# Patient Record
Sex: Female | Born: 1940
Health system: Southern US, Community
[De-identification: ages and names within clinical notes are randomized; demographics above are authoritative.]

## PROBLEM LIST (undated history)

## (undated) DIAGNOSIS — E119 Type 2 diabetes mellitus without complications: Secondary | ICD-10-CM

## (undated) DIAGNOSIS — I10 Essential (primary) hypertension: Secondary | ICD-10-CM

## (undated) DIAGNOSIS — G35 Multiple sclerosis: Secondary | ICD-10-CM

## (undated) HISTORY — PX: BLADDER SURGERY: SHX569

## (undated) HISTORY — PX: ABDOMINAL HYSTERECTOMY: SHX81

---

## 1998-08-23 ENCOUNTER — Ambulatory Visit (HOSPITAL_COMMUNITY): Admission: RE | Admit: 1998-08-23 | Discharge: 1998-08-23 | Payer: Self-pay | Admitting: Pulmonary Disease

## 1999-06-14 ENCOUNTER — Encounter: Payer: Self-pay | Admitting: Obstetrics and Gynecology

## 1999-06-15 ENCOUNTER — Ambulatory Visit (HOSPITAL_COMMUNITY): Admission: RE | Admit: 1999-06-15 | Discharge: 1999-06-15 | Payer: Self-pay | Admitting: Obstetrics and Gynecology

## 2000-04-04 ENCOUNTER — Ambulatory Visit (HOSPITAL_COMMUNITY): Admission: RE | Admit: 2000-04-04 | Discharge: 2000-04-04 | Payer: Self-pay | Admitting: Pulmonary Disease

## 2000-04-04 ENCOUNTER — Encounter: Payer: Self-pay | Admitting: Pulmonary Disease

## 2000-06-17 ENCOUNTER — Ambulatory Visit (HOSPITAL_COMMUNITY): Admission: RE | Admit: 2000-06-17 | Discharge: 2000-06-17 | Payer: Self-pay | Admitting: Obstetrics and Gynecology

## 2000-06-17 ENCOUNTER — Encounter: Payer: Self-pay | Admitting: Obstetrics and Gynecology

## 2000-11-26 ENCOUNTER — Other Ambulatory Visit: Admission: RE | Admit: 2000-11-26 | Discharge: 2000-11-26 | Payer: Self-pay | Admitting: Obstetrics and Gynecology

## 2001-07-09 ENCOUNTER — Ambulatory Visit (HOSPITAL_COMMUNITY): Admission: RE | Admit: 2001-07-09 | Discharge: 2001-07-09 | Payer: Self-pay | Admitting: Obstetrics and Gynecology

## 2001-07-09 ENCOUNTER — Encounter: Payer: Self-pay | Admitting: Obstetrics and Gynecology

## 2001-11-10 ENCOUNTER — Ambulatory Visit (HOSPITAL_COMMUNITY): Admission: RE | Admit: 2001-11-10 | Discharge: 2001-11-10 | Payer: Self-pay | Admitting: Neurology

## 2001-11-10 ENCOUNTER — Encounter: Payer: Self-pay | Admitting: Neurology

## 2002-02-05 ENCOUNTER — Other Ambulatory Visit: Admission: RE | Admit: 2002-02-05 | Discharge: 2002-02-05 | Payer: Self-pay | Admitting: Obstetrics and Gynecology

## 2002-07-14 ENCOUNTER — Encounter: Payer: Self-pay | Admitting: Obstetrics and Gynecology

## 2002-07-14 ENCOUNTER — Ambulatory Visit (HOSPITAL_COMMUNITY): Admission: RE | Admit: 2002-07-14 | Discharge: 2002-07-14 | Payer: Self-pay | Admitting: Obstetrics and Gynecology

## 2002-12-13 ENCOUNTER — Ambulatory Visit (HOSPITAL_COMMUNITY): Admission: RE | Admit: 2002-12-13 | Discharge: 2002-12-13 | Payer: Self-pay | Admitting: Pulmonary Disease

## 2002-12-13 ENCOUNTER — Encounter: Payer: Self-pay | Admitting: Pulmonary Disease

## 2003-08-03 ENCOUNTER — Ambulatory Visit (HOSPITAL_COMMUNITY): Admission: RE | Admit: 2003-08-03 | Discharge: 2003-08-03 | Payer: Self-pay | Admitting: Obstetrics and Gynecology

## 2004-08-03 ENCOUNTER — Ambulatory Visit (HOSPITAL_COMMUNITY): Admission: RE | Admit: 2004-08-03 | Discharge: 2004-08-03 | Payer: Self-pay | Admitting: Obstetrics and Gynecology

## 2004-12-31 ENCOUNTER — Observation Stay (HOSPITAL_COMMUNITY): Admission: RE | Admit: 2004-12-31 | Discharge: 2005-01-01 | Payer: Self-pay | Admitting: Obstetrics and Gynecology

## 2005-01-01 ENCOUNTER — Encounter (INDEPENDENT_AMBULATORY_CARE_PROVIDER_SITE_OTHER): Payer: Self-pay | Admitting: *Deleted

## 2005-08-05 ENCOUNTER — Ambulatory Visit (HOSPITAL_COMMUNITY): Admission: RE | Admit: 2005-08-05 | Discharge: 2005-08-05 | Payer: Self-pay | Admitting: Obstetrics and Gynecology

## 2006-08-07 ENCOUNTER — Ambulatory Visit (HOSPITAL_COMMUNITY): Admission: RE | Admit: 2006-08-07 | Discharge: 2006-08-07 | Payer: Self-pay | Admitting: Obstetrics and Gynecology

## 2006-10-08 ENCOUNTER — Encounter (INDEPENDENT_AMBULATORY_CARE_PROVIDER_SITE_OTHER): Payer: Self-pay | Admitting: Specialist

## 2006-10-08 ENCOUNTER — Ambulatory Visit (HOSPITAL_COMMUNITY): Admission: RE | Admit: 2006-10-08 | Discharge: 2006-10-08 | Payer: Self-pay | Admitting: Gastroenterology

## 2007-08-10 ENCOUNTER — Ambulatory Visit (HOSPITAL_COMMUNITY): Admission: RE | Admit: 2007-08-10 | Discharge: 2007-08-10 | Payer: Self-pay | Admitting: Obstetrics and Gynecology

## 2008-08-10 ENCOUNTER — Ambulatory Visit (HOSPITAL_COMMUNITY): Admission: RE | Admit: 2008-08-10 | Discharge: 2008-08-10 | Payer: Self-pay | Admitting: Obstetrics and Gynecology

## 2010-07-04 ENCOUNTER — Other Ambulatory Visit (HOSPITAL_COMMUNITY): Payer: Self-pay | Admitting: Obstetrics and Gynecology

## 2010-07-04 DIAGNOSIS — Z1231 Encounter for screening mammogram for malignant neoplasm of breast: Secondary | ICD-10-CM

## 2010-07-10 ENCOUNTER — Ambulatory Visit (HOSPITAL_COMMUNITY): Payer: Medicare Other

## 2010-07-18 ENCOUNTER — Ambulatory Visit (HOSPITAL_COMMUNITY)
Admission: RE | Admit: 2010-07-18 | Discharge: 2010-07-18 | Disposition: A | Discharge status: Home / Self Care | Payer: Medicare Other | Source: Ambulatory Visit | Attending: Obstetrics and Gynecology | Admitting: Obstetrics and Gynecology

## 2010-07-18 DIAGNOSIS — Z1231 Encounter for screening mammogram for malignant neoplasm of breast: Secondary | ICD-10-CM

## 2010-10-09 NOTE — Op Note (Signed)
NAME:  Morgan [name], Morgan [name]                 ACCOUNT NO.:  [id number]   MEDICAL RECORD NO.:  [id number]          PATIENT TYPE:  AMB   LOCATION:  ENDO                         FACILITY:  [facility]   PHYSICIAN:  Anselmo Rod, M.D.  DATE OF BIRTH:  April 17, 1941   DATE OF PROCEDURE:  10/08/2006  DATE OF DISCHARGE:                               OPERATIVE REPORT   PROCEDURE PERFORMED:  Colonoscopy with snare polypectomy x3 and cold  biopsies x3.   ENDOSCOPIST:  Anselmo Rod, M.D.   INSTRUMENT USED:  Pentax video colonoscope.   INDICATIONS FOR PROCEDURE:  70 year old African American female  undergoing screening colonoscopy to rule out colonic polyps, masses,  etc.   PREPROCEDURE PREPARATION:  Informed consent was procured from the  patient.  The patient fasted for 8 hours prior to the procedure after  being prepped with a bottle of magnesium citrate and a gallon of  NuLytely the night prior to the procedure.  Risks and benefits of the  procedure including 10% miss rate of cancer and polyp were discussed  with the patient as well.   PREPROCEDURE PHYSICAL:  The patient had stable vital signs.  Neck  supple.  Chest clear to auscultation.  S1, S2 regular.  Abdomen soft  with normal bowel sounds.   DESCRIPTION OF PROCEDURE:  The patient was placed in the left lateral  decubitus position and sedated with 100 mcg of Fentanyl and 10 mg of  Versed given intravenously in slow incremental doses. Once the patient  was adequately sedated and maintained on low-flow oxygen and continuous  cardiac monitoring, the Pentax video colonoscope was advanced from the  rectum to the cecum.  Three small sessile polyps were removed from the  cecum and proximal right colon (one from the cecum and two from the  proximal right colon, respectively).Three small erosions were biopsied  from the rectosigmoid colon from 20-30 cm.  No other abnormalities were  noted.  No diverticula were present.  The terminal ileum was not  visualized.  The patient has small internal hemorrhoids seen on  retroflexion.  The patient had a very tortuous colon.  The patient's  position was changed from the left lateral to the supine and the right  lateral position with gentle application of abdominal pressure reached  the cecum.   IMPRESSION:  1. Three polyps removed from the right side, one from the cecum and      two from the proximal right colon (hot snare x3).  2. Three small erosions biopsied from 20-30 cm.  3. Small internal hemorrhoids seen on retroflexion.  4. No evidence of diverticulosis.   RECOMMENDATIONS:  1. Await pathology results.  2. Avoid all nonsteroidals including aspirin for the next 2 weeks.  3. Repeat colonoscopy depending on pathology results.  4. Outpatient follow-up as need arises in the future.      Anselmo Rod, M.D.  Electronically Signed     JNM/MEDQ  D:  10/08/2006  T:  10/08/2006  Job:  [id number]   cc:   Mina Marble, M.D.  Janine Limbo, M.D.

## 2010-10-12 NOTE — Op Note (Signed)
Morgan Morgan [name], Morgan [name]                 ACCOUNT NO.:  [id number]   MEDICAL RECORD NO.:  [id number]          PATIENT TYPE:  OBV   LOCATION:  9316                          [facility]  [facility]   PHYSICIAN:  Osborn Coho, M.D.   DATE OF BIRTH:  1940/07/19   DATE OF PROCEDURE:  12/31/2004  DATE OF DISCHARGE:  01/01/2005                                 OPERATIVE REPORT   PREOPERATIVE DIAGNOSES:  1.  Symptomatic cystocele, rectocele, enterocele.  2.  Urinary incontinence.   POSTOPERATIVE DIAGNOSES:  1.  Symptomatic cystocele, rectocele, enterocele.  2.  Urinary incontinence.  3.  Bladder masses.   OPERATION/PROCEDURE:  1.  Anterior and posterior repair.  2.  Enterocele repair.  3.  Transvaginal taping.  4.  Cystoscopy.   SURGEON:  Osborn Coho, M.D.   ASSISTANT:  Marquis Lunch. Lowell Guitar, P.A.-C.   ANESTHESIA:  General.   SPECIMEN TO CYTOLOGY:  Urine for cytology.   FLUIDS:  1500 mL.   URINARY OUTPUT:  500 mL.   ESTIMATED BLOOD LOSS:  300 mL.   COMPLICATIONS:  None.   FINDINGS:  In total approximately 1-2 cm what appeared to be bladder masses.   DESCRIPTION OF PROCEDURE:  The patient was taken to the operating room after  the risks, benefits and alternatives were discussed with the patient.  The  patient verbalized understanding and consent signed and witnessed.  The  patient was placed under general anesthesia and prepped and draped in the  normal sterile fashion in the dorsal lithotomy position.  A weighted  speculum was placed in the vagina and the anterior vaginal wall injected  with loop Pitressin.  The vaginal wall mucosa was excised and the underlying  cystocele dissected away from the vaginal mucosa.  Cystocele was repaired  using a pursestring stitch of 2-0 chromic.  Kelly plications stitches were  then performed using 2-0 Vicryl along the length of the cystocele.  The  vaginal mucosa was then reapproximated using interrupted 2-0 Vicryl.  Attention was then turned to the  posterior vaginal wall which was injected  with dilute Pitressin.  The posterior vaginal wall was incised in the  midline and the rectocele dissected away from the vaginal wall mucosa.  Enterocele was noted and enterocele sac was repaired using a pursestring  stitch of 2-0 chromic.  The rectocele was repaired using plication stitches  of 2-0 Vicryl.  The vaginal mucosa was reapproximated using 2-0 Vicryl  interrupteds.   Attention was then turned to the anterior vaginal wall underlying the mid  urethra and was injected with dilute Pitressin.  The mucosa was incised and  the underlying tissue and urethra were dissected away making two pockets  bilaterally toward the symphysis pubis.   Attention was then turned to the mons pubis and at the level of the  symphysis pubis, two fingerbreadths from the midline bilaterally, a 5 mm  incision was made.  The abdominal guide was passed from the mons pubis  incisions through the pockets created at the level of the mid urethra along  the anterior vaginal wall.  The TVT was then  attached and elevated up  through the incisions after cystoscopy was performed and no entry of the  abdominal guides were noted into the bladder.  Prior to passing the  abdominal guides, the bladder was emptied completely and an 18 gauge  catheter with ureteral catheter guide was placed to divert the urethra as  the abdominal guides were passed bilaterally.  After the tape was elevated  through the bilateral mons pubis incisions, cystoscopy was performed again  and no entry into the bladder was noted.  There were two approximately 0.5  cm areas of bladder masses that were noted and adjacent to one another.  Urine cytology was sent.  The tape was flat beneath the urethra leaving  approximately 0.5 cm between the tape and the mid urethra.  The sheaths were  removed and the tape excised flush with the incisions at the mons pubis.  The vaginal mucosa was repaired using interrupteds  of 2-0 Vicryl and the  mons pubis incisions were repaired with 4-0 Monocryl using a subcuticular  stitch.  An 18-French catheter was used throughout this portion of the  procedure and throughout the urethra and catheter were prepped with  Betadine.  The vagina was packed with estrace soaked, two-inch plain  packing.  Sponge, lap and needle counts were correct.  The patient tolerated  the procedure well and was returned to the recovery room in good condition.      Osborn Coho, M.D.  Electronically Signed     AR/MEDQ  D:  01/18/2005  T:  01/18/2005  Job:  [id number]

## 2010-10-12 NOTE — H&P (Signed)
NAMEJUANNA, PUDLO                 ACCOUNT NO.:  [id number]   MEDICAL RECORD NO.:  [id number]          PATIENT TYPE:  AMB   LOCATION:  SDC                           [facility]ACILITY:  [facility]   PHYSICIAN:  Osborn Coho, M.D.   DATE OF BIRTH:  1940-06-11   DATE OF ADMISSION:  DATE OF DISCHARGE:                                HISTORY & PHYSICAL   CHIEF COMPLAINT:  Urge and stress incontinence and prolapsed bladder.   HISTORY:  Morgan [name] is a 70 year old gravida 2 para 2 status post a  hysterectomy about 25 years ago with complaints of mixed urinary  incontinence and prolapsed bladder. She has tried Scientist, clinical (histocompatibility and immunogenetics) but self  discontinued the Detrol secondary to fear of taking medicines and causing  problems with her eyes.   PAST OBSTETRICAL HISTORY:  Spontaneous vaginal deliveries full-term x2.   PAST GYNECOLOGICAL HISTORY:  Status post hysterectomy. Denies a history of  gonorrhea, chlamydia. Denies a history of abnormal Pap smears. Last  mammogram in March 2006 was negative.   PAST MEDICAL HISTORY:  Multiple sclerosis.   PAST SURGICAL HISTORY:  Abdominal hysterectomy.   MEDICINES:  Altace, Ultram, Flexeril, and antihypertensive medications.   SOCIAL HISTORY:  Denies cigarette use, alcohol use, or illicit drug use.   ALLERGIES:  No known drug allergies.   FAMILY HISTORY:  Stomach cancer in mother, lung cancer in dad, and prostate  cancer.   REVIEW OF SYSTEMS:  Denies chest pain, shortness of breath with activity.  Denies any GI problems or GU problems except as noted above. Denies fevers,  chills, nausea, vomiting, diarrhea.   PHYSICAL EXAMINATION:  VITAL SIGNS:  Blood pressure 124/90.  HEENT:  Within normal limits. Thyroid not enlarged.  HEART:  Rate and rhythm are regular.  CHEST:  Clear to auscultation bilaterally.  BREAST:  Exam in December 2005 is without masses, discharge, skin change, or  nipple retraction.  BACK:  No CVA tenderness.  ABDOMEN:  Soft and nontender.  EXTREMITIES:   Within normal limits.  PELVIC:  Normal external genitalia, atrophic vagina. Large cystocele.  TESTING:  Cystometrics:  Q-tip of 70-75 degrees, bladder capacity 150 mL.   ASSESSMENT AND PLAN:  Morgan [name] is a 70 year old gravida 2 para 2 with a  history of mixed urinary incontinence, hypermobile urethra and symptomatic  cystocele. The patient has tried conservative management and now would like  surgical management with repair of the cystocele, possibly repair of a  rectocele, and TVT procedure. The risks, benefits, and alternatives have  been reviewed with the patient including but not limited to bleeding,  infection, injury, urinary retention, and erosion of mesh. The patient  verbalized understanding and questions answered. Preoperative labs to be  done prior to procedure and consents to be signed and witnessed prior to  procedure as well.      Osborn Coho, M.D.  Electronically Signed     AR/MEDQ  D:  12/31/2004  T:  12/31/2004  Job:  [id number]

## 2010-11-01 ENCOUNTER — Inpatient Hospital Stay (INDEPENDENT_AMBULATORY_CARE_PROVIDER_SITE_OTHER)
Admission: RE | Admit: 2010-11-01 | Discharge: 2010-11-01 | Disposition: A | Discharge status: Home / Self Care | Payer: Medicare Other | Source: Ambulatory Visit | Attending: Family Medicine | Admitting: Family Medicine

## 2010-11-01 DIAGNOSIS — T783XXA Angioneurotic edema, initial encounter: Secondary | ICD-10-CM

## 2011-04-11 ENCOUNTER — Other Ambulatory Visit: Payer: Self-pay | Admitting: Neurology

## 2011-04-11 DIAGNOSIS — W19XXXA Unspecified fall, initial encounter: Secondary | ICD-10-CM

## 2011-04-11 DIAGNOSIS — G35 Multiple sclerosis: Secondary | ICD-10-CM

## 2011-04-11 DIAGNOSIS — S0990XA Unspecified injury of head, initial encounter: Secondary | ICD-10-CM

## 2011-04-11 DIAGNOSIS — E119 Type 2 diabetes mellitus without complications: Secondary | ICD-10-CM

## 2011-04-11 DIAGNOSIS — H409 Unspecified glaucoma: Secondary | ICD-10-CM

## 2011-04-16 ENCOUNTER — Ambulatory Visit
Admission: RE | Admit: 2011-04-16 | Discharge: 2011-04-16 | Disposition: A | Discharge status: Home / Self Care | Payer: Medicare Other | Source: Ambulatory Visit | Attending: Neurology | Admitting: Neurology

## 2011-04-16 DIAGNOSIS — G35 Multiple sclerosis: Secondary | ICD-10-CM

## 2011-04-16 DIAGNOSIS — E119 Type 2 diabetes mellitus without complications: Secondary | ICD-10-CM

## 2011-04-16 DIAGNOSIS — W19XXXA Unspecified fall, initial encounter: Secondary | ICD-10-CM

## 2011-04-16 DIAGNOSIS — H409 Unspecified glaucoma: Secondary | ICD-10-CM

## 2011-04-16 DIAGNOSIS — S0990XA Unspecified injury of head, initial encounter: Secondary | ICD-10-CM

## 2012-08-21 ENCOUNTER — Emergency Department (INDEPENDENT_AMBULATORY_CARE_PROVIDER_SITE_OTHER): Payer: Medicare Other

## 2012-08-21 ENCOUNTER — Emergency Department (INDEPENDENT_AMBULATORY_CARE_PROVIDER_SITE_OTHER)
Admission: EM | Admit: 2012-08-21 | Discharge: 2012-08-21 | Disposition: A | Discharge status: Home / Self Care | Payer: Medicare Other | Source: Home / Self Care | Attending: Family Medicine | Admitting: Family Medicine

## 2012-08-21 ENCOUNTER — Encounter (HOSPITAL_COMMUNITY): Payer: Self-pay | Admitting: Emergency Medicine

## 2012-08-21 DIAGNOSIS — S63599A Other specified sprain of unspecified wrist, initial encounter: Secondary | ICD-10-CM

## 2012-08-21 DIAGNOSIS — S66819A Strain of other specified muscles, fascia and tendons at wrist and hand level, unspecified hand, initial encounter: Secondary | ICD-10-CM

## 2012-08-21 HISTORY — DX: Essential (primary) hypertension: I10

## 2012-08-21 HISTORY — DX: Multiple sclerosis: G35

## 2012-08-21 HISTORY — DX: Type 2 diabetes mellitus without complications: E11.9

## 2012-08-21 NOTE — ED Notes (Signed)
Pt states that she was in exercise class today and bent over to get ball and fell over falling on left arm injuring wrist. Some mild swelling present.   Ice was applied to wrist which helped with pain but pain has gradually came back and getting worse.   Incident happened today between 10/ 11 a.m this morning.

## 2012-08-21 NOTE — ED Provider Notes (Signed)
History     CSN: [id number]  Arrival date & time 08/21/12  1845   None     Chief Complaint  Patient presents with  . Wrist Injury    fell on left arm injury left wrist. some mild swelling    (Consider location/radiation/quality/duration/timing/severity/associated sxs/prior treatment) HPI Comments: Pt was chasing after ball in exercise class, lost balance and fell, catching self with L wrist.   Patient is a 72 y.o. female presenting with wrist injury. The history is provided by the patient.  Wrist Injury Location:  Wrist Time since incident:  10 hours Wrist location:  L wrist Pain details:    Quality:  Aching and throbbing   Radiates to:  Does not radiate   Severity:  Mild   Onset quality:  Sudden   Duration:  10 hours   Timing:  Constant   Progression:  Unchanged Chronicity:  New Relieved by:  Nothing Worsened by:  Movement and bearing weight Ineffective treatments:  Ice Associated symptoms: decreased range of motion, muscle weakness and swelling   Associated symptoms: no fever, no numbness and no tingling     Past Medical History  Diagnosis Date  . Hypertension   . Diabetes mellitus without complication   . Multiple sclerosis     Past Surgical History  Procedure Laterality Date  . Abdominal hysterectomy    . Bladder surgery      History reviewed. No pertinent family history.  History  Substance Use Topics  . Smoking status: Never Smoker   . Smokeless tobacco: Not on file  . Alcohol Use: No    OB History   Grav Para Term Preterm Abortions TAB SAB Ect Mult Living                  Review of Systems  Constitutional: Negative for fever and chills.  Musculoskeletal: Positive for joint swelling.       Wrist injury  Neurological: Positive for weakness. Negative for numbness.    Allergies  Review of patient's allergies indicates no known allergies.  Home Medications   Current Outpatient Rx  Name  Route  Sig  Dispense  Refill  . Carvedilol  (COREG PO)   Oral   Take by mouth.         . Cyclobenzaprine HCl (FLEXERIL PO)   Oral   Take by mouth.         . TRAMADOL HCL PO   Oral   Take by mouth.         . TRIAMTERENE-HCTZ PO   Oral   Take by mouth.           BP 145/62  Pulse 90  Temp(Src) 98.1 F (36.7 C) (Oral)  Resp 16  Physical Exam  Constitutional: She appears well-developed and well-nourished. No distress.  Cardiovascular:  Pulses:      Radial pulses are 2+ on the left side.  Musculoskeletal:       Left wrist: She exhibits decreased range of motion, tenderness, bony tenderness and swelling. She exhibits no deformity.  Pain with ROM and palpation ulnar aspect of L wrist, swelling radial aspect.  Reduced rom due to pain. Increased pain with hyperextension of L wrist.   Neurological:  Decreased strength R wrist   Skin: Skin is warm, dry and intact.    ED Course  Procedures (including critical care time)  Labs Reviewed - No data to display Dg Wrist Complete Left  08/21/2012  *RADIOLOGY REPORT*  Clinical Data: Fall with  pain and swelling.  LEFT WRIST - COMPLETE 3+ VIEW  Comparison: None.  Findings: Four views of the left wrist were obtained.  The wrist is located and there is no evidence for a displaced fracture.  There is no gross soft tissue abnormality.  The carpal bones are intact. Mild degenerative changes at the first carpometacarpal joint.  IMPRESSION: No acute bony abnormality.   Original Report Authenticated By: Richarda Overlie, M.D.      1. Wrist sprain and strain, left, initial encounter       MDM  Wrist splint placed, pt has own tramadol to use for pain, pt to f/u with Gramig for further care.         Cathlyn Parsons, NP 08/21/12 2001

## 2012-08-23 NOTE — ED Provider Notes (Signed)
Medical screening examination/treatment/procedure(s) were performed by resident physician or non-physician practitioner and as supervising physician I was immediately available for consultation/collaboration.   KINDL,JAMES DOUGLAS MD.   James D Kindl, MD 08/23/12 1108 

## 2012-12-11 ENCOUNTER — Ambulatory Visit (HOSPITAL_COMMUNITY)
Admission: RE | Admit: 2012-12-11 | Discharge: 2012-12-11 | Disposition: A | Discharge status: Home / Self Care | Payer: Medicare Other | Source: Ambulatory Visit | Attending: Pulmonary Disease | Admitting: Pulmonary Disease

## 2012-12-11 ENCOUNTER — Other Ambulatory Visit (HOSPITAL_COMMUNITY): Payer: Self-pay | Admitting: Pulmonary Disease

## 2012-12-11 DIAGNOSIS — M79609 Pain in unspecified limb: Secondary | ICD-10-CM | POA: Insufficient documentation

## 2012-12-11 DIAGNOSIS — R52 Pain, unspecified: Secondary | ICD-10-CM

## 2012-12-11 DIAGNOSIS — M25559 Pain in unspecified hip: Secondary | ICD-10-CM | POA: Insufficient documentation

## 2012-12-11 DIAGNOSIS — M4120 Other idiopathic scoliosis, site unspecified: Secondary | ICD-10-CM | POA: Insufficient documentation

## 2012-12-11 DIAGNOSIS — M1610 Unilateral primary osteoarthritis, unspecified hip: Secondary | ICD-10-CM | POA: Insufficient documentation

## 2012-12-11 DIAGNOSIS — M169 Osteoarthritis of hip, unspecified: Secondary | ICD-10-CM | POA: Insufficient documentation

## 2012-12-11 DIAGNOSIS — M51379 Other intervertebral disc degeneration, lumbosacral region without mention of lumbar back pain or lower extremity pain: Secondary | ICD-10-CM | POA: Insufficient documentation

## 2012-12-11 DIAGNOSIS — M5137 Other intervertebral disc degeneration, lumbosacral region: Secondary | ICD-10-CM | POA: Insufficient documentation

## 2013-04-08 ENCOUNTER — Other Ambulatory Visit (HOSPITAL_COMMUNITY): Payer: Self-pay | Admitting: Pulmonary Disease

## 2013-04-08 ENCOUNTER — Ambulatory Visit (HOSPITAL_COMMUNITY)
Admission: RE | Admit: 2013-04-08 | Discharge: 2013-04-08 | Disposition: A | Discharge status: Home / Self Care | Payer: Medicare Other | Source: Ambulatory Visit | Attending: Pulmonary Disease | Admitting: Pulmonary Disease

## 2013-04-08 DIAGNOSIS — M542 Cervicalgia: Secondary | ICD-10-CM | POA: Insufficient documentation

## 2013-04-08 DIAGNOSIS — R52 Pain, unspecified: Secondary | ICD-10-CM

## 2013-07-07 ENCOUNTER — Telehealth: Payer: Self-pay | Admitting: Neurology

## 2013-07-07 NOTE — Telephone Encounter (Signed)
Gave to Morgan JerseySandy Y for reassignment

## 2013-07-07 NOTE — Telephone Encounter (Signed)
Reassigned to Dr. Vickey Huger (MS)

## 2013-07-07 NOTE — Telephone Encounter (Signed)
Prior Dr. Sandria Manly patient.  She needs to be assigned to a provider so she can make an appointment for her yearly visit.  Please call.  Thank you

## 2013-07-08 ENCOUNTER — Telehealth: Payer: Self-pay | Admitting: Neurology

## 2013-07-08 NOTE — Telephone Encounter (Signed)
Former [facility] patient, attempted to call both numbers on file to schedule with Dr. Vickey Hugerohmeier. Mobile mailbox was full and home number has been disconnected.

## 2013-07-29 ENCOUNTER — Telehealth: Payer: Self-pay | Admitting: Neurology

## 2013-07-29 NOTE — Telephone Encounter (Signed)
Patient is former Dr. Sandria Manly patient and is calling to schedule an appointment and needs to be reassigned. Please call patient.

## 2013-07-29 NOTE — Telephone Encounter (Signed)
Reassigned to Dr. Penumalli. 

## 2013-11-01 ENCOUNTER — Encounter: Payer: Self-pay | Admitting: Diagnostic Neuroimaging

## 2013-11-01 ENCOUNTER — Ambulatory Visit (INDEPENDENT_AMBULATORY_CARE_PROVIDER_SITE_OTHER): Payer: Medicare Other | Admitting: Diagnostic Neuroimaging

## 2013-11-01 VITALS — BP 153/71 | HR 77 | Temp 98.4°F | Ht 67.0 in | Wt 153.0 lb

## 2013-11-01 DIAGNOSIS — R29898 Other symptoms and signs involving the musculoskeletal system: Secondary | ICD-10-CM

## 2013-11-01 DIAGNOSIS — R269 Unspecified abnormalities of gait and mobility: Secondary | ICD-10-CM

## 2013-11-01 DIAGNOSIS — G35 Multiple sclerosis: Secondary | ICD-10-CM

## 2013-11-01 NOTE — Progress Notes (Signed)
[facility]  PATIENT: Morgan [name] DOB: [birth year]  REFERRING CLINICIAN: Kilpatrick HISTORY FROM: patient  REASON FOR VISIT: follow up (Dr. Sandria [name] transfer)   HISTORICAL  CHIEF COMPLAINT:  Chief Complaint  Patient presents with  . Follow-up    MS, migraine    HISTORY OF PRESENT ILLNESS:   UPDATE 11/01/13: Since last visit, was doing well until 2 months ago. He brother passed away, and soon after, she was having more bilateral weakness in legs/body, with bilateral arm numbness. She went to PCP who started her on prednisone 1mg g daily, and she has continued on this for past 2 months. She feels a little better. Still having to use a cane and walk very slowly.   PRIOR HPI (06/04/12, Dr. Sandria [name]): [name] with a history of transverse myelitis in 1976 and documented periventricular white matter lesions on MRI of the brain with and without contrast 11/10/01 and abnormal cord signal from C3-C7 11/10/01. She was first seen by Dr. Maximino GreenlandMartin [name] in 1977 with transverse myelitis and next seen by him in 1987. MRI study of the brain in 1987 was compatible with multiple sclerosis.  She has never been on immunomodulation treatment. She denies bowel or bladder dysfunction, Lhermitte's sign, or visual loss. She has formerly been an Community education officerexerciser. She walks without a cane or walker.She has diabetes mellitus treated with metformin 500 mg once daily with capillary blood glucoses in the 90-100 range.She is independent in her activities of daily living. Occasionally she had memory problems and slurred speech.  She had 2 falls last year but none in the last 6 months. 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  She didn't activities of daily living and drives a car.  She notes left-sided facial  numbness when talking that lasts a few minutes about once per week.It involves the one in V2.  She feels tired.   When she does she takes a nap.  She is cooking for one week once per month and a group home.  She enjoys working with mentally handicapped. She has a one-time skin rash over her hands. She notes right anterior thigh numbness which comes and goes.   REVIEW OF SYSTEMS: Full 14 system review of systems performed and notable only as per HPI. Otherwise negative.  ALLERGIES: Allergies  Allergen Reactions  . Ibuprofen Rash    HOME MEDICATIONS: Outpatient Prescriptions Prior to Visit  Medication Sig Dispense Refill  . Carvedilol (COREG PO) Take by mouth.      . Cyclobenzaprine HCl (FLEXERIL PO) Take by mouth.      . TRAMADOL HCL PO Take by mouth.      . TRIAMTERENE-HCTZ PO Take by mouth.       No facility-administered medications prior to visit.    PAST MEDICAL HISTORY: Past Medical History  Diagnosis Date  . Hypertension   . Diabetes mellitus without complication   . Multiple sclerosis     PAST SURGICAL HISTORY: Past Surgical History  Procedure Laterality Date  . Abdominal hysterectomy    . Bladder surgery      FAMILY HISTORY: Family History  Problem Relation Age of Onset  . Stomach cancer Mother   . Pneumonia Father   . Cancer Father     prostate, lung    SOCIAL HISTORY:  History   Social History  . Marital Status: Widowed    Spouse Name: N/A    Number of Children:  N/A  . Years of Education: N/A   Occupational History  . Not on file.   Social History Main Topics  . Smoking status: Never Smoker   . Smokeless tobacco: Never Used  . Alcohol Use: No  . Drug Use: No  . Sexual Activity: No   Other Topics Concern  . Not on file   Social History Narrative   Patient lives at home with family.   Caffeine Use: 2-3 cups daily     PHYSICAL EXAM  Filed Vitals:   11/01/13 1459  BP: 153/71  Pulse: 77  Temp: 98.4 F (36.9 C)  TempSrc: Oral  Height: 5\' 7"  (1.702 m)  Weight: 153 lb (69.4 kg)    Not recorded    Body mass index is 23.96  kg/(m^2).  GENERAL EXAM: Patient is in no distress; well developed, nourished and groomed; neck is supple  CARDIOVASCULAR: Regular rate and rhythm, no murmurs, no carotid bruits  NEUROLOGIC: MENTAL STATUS: awake, alert, language fluent, comprehension intact, naming intact, fund of knowledge appropriate CRANIAL NERVE: no papilledema on fundoscopic exam, pupils equal and reactive to light, visual fields full to confrontation, extraocular muscles intact, no nystagmus, facial sensation and strength symmetric, hearing intact, palate elevates symmetrically, uvula midline, shoulder shrug symmetric, tongue midline. MOTOR: BUE 4, BLE 3 PROX AND 4 DISTAL.  SENSORY: DECR VIB AND PP IN FEET COORDINATION: finger-nose-finger, fine finger movements normal REFLEXES: deep tendon reflexes present and symmetric GAIT/STATION: SLOW UNSTEADY GAIT. SLOW TO RISE. USES SINGLE POINT CANE. HEAD DOWN. STOOPED POSTURE.     DIAGNOSTIC DATA (LABS, IMAGING, TESTING) - I reviewed patient records, labs, notes, testing and imaging myself where available.  No results found for this basename: WBC, HGB, HCT, MCV, PLT   No results found for this basename: na, k, cl, co2, glucose, bun, creatinine, calcium, prot, albumin, ast, alt, alkphos, bilitot, gfrnonaa, gfraa   No results found for this basename: CHOL, HDL, LDLCALC, LDLDIRECT, TRIG, CHOLHDL   No results found for this basename: HGBA1C   No results found for this basename: VITAMINB12   No results found for this basename: TSH    I reviewed images myself and agree with interpretation. -[facility]  04/16/11 CT head - severe periventricular and subcortical white matter dz   ASSESSMENT AND PLAN  73 y.o. year old female here with multiple sclerosis since 1977, never on disease modifying therapy. Now with possible MS exacerbation 2 months ago.   PLAN: - MRI brain and c-spine - taper off oral prednisone - may consider IV solumedrol after MRI scans if enhancing lesions  are present - may consider disease modifying therapy after scans - home health PT evaluation  Orders Placed This Encounter  Procedures  . MR Brain W Wo Contrast  . MR Cervical Spine W Wo Contrast  . Home Health  . Face-to-face encounter (required for Medicare/Medicaid patients)   Return in about 3 months (around 02/01/2014).   Suanne Marker, MD 11/01/2013, 3:44 PM Certified in Neurology, Neurophysiology and Neuroimaging  [facility] [address] [place], [place] [address] [phone]

## 2013-11-01 NOTE — Patient Instructions (Signed)
I will check MRI brain and cervical spine. 

## 2013-12-30 NOTE — Telephone Encounter (Signed)
Noted  

## 2014-01-17 ENCOUNTER — Other Ambulatory Visit (HOSPITAL_COMMUNITY): Payer: Self-pay | Admitting: Pulmonary Disease

## 2014-01-17 DIAGNOSIS — Z1231 Encounter for screening mammogram for malignant neoplasm of breast: Secondary | ICD-10-CM

## 2014-01-25 ENCOUNTER — Ambulatory Visit (HOSPITAL_COMMUNITY): Payer: Medicare Other | Attending: Pulmonary Disease

## 2014-02-04 ENCOUNTER — Ambulatory Visit (INDEPENDENT_AMBULATORY_CARE_PROVIDER_SITE_OTHER): Payer: Medicare Other | Admitting: Diagnostic Neuroimaging

## 2014-02-04 ENCOUNTER — Encounter: Payer: Self-pay | Admitting: Diagnostic Neuroimaging

## 2014-02-04 VITALS — BP 125/65 | HR 70 | Ht 66.0 in | Wt 151.8 lb

## 2014-02-04 DIAGNOSIS — R200 Anesthesia of skin: Secondary | ICD-10-CM

## 2014-02-04 DIAGNOSIS — G35 Multiple sclerosis: Secondary | ICD-10-CM

## 2014-02-04 DIAGNOSIS — R209 Unspecified disturbances of skin sensation: Secondary | ICD-10-CM

## 2014-02-04 NOTE — Patient Instructions (Signed)
Continue current medications. 

## 2014-02-04 NOTE — Progress Notes (Signed)
[facility]  PATIENT: Morgan [name] DOB: 11-09-1940  REFERRING CLINICIAN: Kilpatrick HISTORY FROM: patient  REASON FOR VISIT: follow up    HISTORICAL  CHIEF COMPLAINT:  No chief complaint on file.   HISTORY OF PRESENT ILLNESS:    UPDATE 02/04/14: Since last visit, walking and balance much better. Now back to baseline gait. However, numbness is hands is slightly improved but not resolved. Also, had lost her insurance and couldn't get MRIs or home PT. Now has insurance again.  UPDATE 11/01/13: Since last visit, was doing well until 2 months ago. He brother passed away, and soon after, she was having more bilateral weakness in legs/body, with bilateral arm numbness. She went to PCP who started her on prednisone  daily, and she has continued on this for past 2 months. She feels a little better. Still having to use a cane and walk very slowly.   PRIOR HPI (06/04/12, Dr. Sandria Manly): [name] from [place], [place] with a history of transverse myelitis in 1976 and documented periventricular white matter lesions on MRI of the brain with and without contrast 11/10/01 and abnormal cord signal from C3-C7 11/10/01. She was first seen by Dr. Maximino Greenland in 1977 with transverse myelitis and next seen by him in 1987. MRI study of the brain in 1987 was compatible with multiple sclerosis.  She has never been on immunomodulation treatment. She denies bowel or bladder dysfunction, Lhermitte's sign, or visual loss. She has formerly been an Community education officer. She walks without a cane or walker.She has diabetes mellitus treated with metformin 500 mg once daily with capillary blood glucoses in the 90-100 range.She is independent in her activities of daily living. Occasionally she had memory problems and slurred speech.  She had 2 falls last year but none in the last 6 months. 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  She didn't  activities of daily living and drives a car.  She notes left-sided facial  numbness when talking that lasts a few minutes about once per week.It involves the one in V2.  She feels tired.  When she does she takes a nap.  She is cooking for one week once per month and a group home.  She enjoys working with mentally handicapped. She has a one-time skin rash over her hands. She notes right anterior thigh numbness which comes and goes.   REVIEW OF SYSTEMS: Full 14 system review of systems performed and notable only as per HPI. Otherwise negative.  ALLERGIES: Allergies  Allergen Reactions  . Ibuprofen Rash    HOME MEDICATIONS: Outpatient Prescriptions Prior to Visit  Medication Sig Dispense Refill  . Carvedilol (COREG PO) Take by mouth.      . Cyclobenzaprine HCl (FLEXERIL PO) Take by mouth.      . TRAMADOL HCL PO Take by mouth.      . TRIAMTERENE-HCTZ PO Take by mouth.      Marland Kitchen PREDNISONE, PAK, PO Take 10 mg by mouth daily.       No facility-administered medications prior to visit.    PAST MEDICAL HISTORY: Past Medical History  Diagnosis Date  . Hypertension   . Diabetes mellitus without complication   . Multiple sclerosis     PAST SURGICAL HISTORY: Past Surgical History  Procedure Laterality Date  . Abdominal hysterectomy    . Bladder surgery      FAMILY HISTORY: Family History  Problem Relation Age of Onset  . Stomach  cancer Mother   . Pneumonia Father   . Cancer Father     prostate, lung    SOCIAL HISTORY:  History   Social History  . Marital Status: Widowed    Spouse Name: N/A    Number of Children: 2  . Years of Education: BS   Occupational History  . Retired Other   Social History Main Topics  . Smoking status: Never Smoker   . Smokeless tobacco: Never Used  . Alcohol Use: No  . Drug Use: No  . Sexual Activity: No   Other Topics Concern  . Not on file   Social History Narrative   Patient lives at home with family.   Caffeine Use: 2-3 cups daily       PHYSICAL EXAM  Filed Vitals:   02/04/14 1148  BP: 125/65  Pulse: 70  Height: 5\' 6"  (1.676 m)  Weight: 151 lb 12.8 oz (68.856 kg)    Not recorded    Body mass index is 24.51 kg/(m^2).  GENERAL EXAM: Patient is in no distress; well developed, nourished and groomed; neck is supple  CARDIOVASCULAR: Regular rate and rhythm, no murmurs, no carotid bruits  NEUROLOGIC: MENTAL STATUS: awake, alert, language fluent, comprehension intact, naming intact, fund of knowledge appropriate CRANIAL NERVE: no papilledema on fundoscopic exam, pupils equal and reactive to light, visual fields full to confrontation, extraocular muscles intact, no nystagmus, facial sensation and strength symmetric, hearing intact, palate elevates symmetrically, uvula midline, shoulder shrug symmetric, tongue midline. MOTOR: BUE 4, BLE 4 SENSORY: DECR VIB AND PP IN FEET COORDINATION: finger-nose-finger, fine finger movements normal REFLEXES: deep tendon reflexes BRISK and symmetric GAIT/STATION: SLOW UNSTEADY GAIT. SLOW TO RISE. USES SINGLE POINT CANE. HEAD DOWN. STOOPED POSTURE.     DIAGNOSTIC DATA (LABS, IMAGING, TESTING) - I reviewed patient records, labs, notes, testing and imaging myself where available.  No results found for this basename: WBC,  HGB,  HCT,  MCV,  PLT   No results found for this basename: na,  k,  cl,  co2,  glucose,  bun,  creatinine,  calcium,  prot,  albumin,  ast,  alt,  alkphos,  bilitot,  gfrnonaa,  gfraa   No results found for this basename: CHOL,  HDL,  LDLCALC,  LDLDIRECT,  TRIG,  CHOLHDL   No results found for this basename: HGBA1C   No results found for this basename: VITAMINB12   No results found for this basename: TSH    I reviewed images myself and agree with interpretation. -VRP  04/16/11 CT head - severe periventricular and subcortical white matter dz   ASSESSMENT AND PLAN  73 y.o. year old female here with multiple sclerosis since 1977, never on disease  modifying therapy. Now with possible MS exacerbation in April 2015. Symptoms have spontaneously improved.  PLAN: -  if any worsening of symptoms, then will check MRIs and EMG - observation for now; patient agrees with plan  Return in about 6 months (around 08/05/2014).   Suanne Marker, MD 02/04/2014, 12:33 PM Certified in Neurology, Neurophysiology and Neuroimaging  [facility] [address] [place], [place] [address] [phone]

## 2014-05-05 ENCOUNTER — Ambulatory Visit (INDEPENDENT_AMBULATORY_CARE_PROVIDER_SITE_OTHER): Payer: Medicare Other

## 2014-05-05 ENCOUNTER — Encounter: Payer: Self-pay | Admitting: Podiatry

## 2014-05-05 ENCOUNTER — Ambulatory Visit (INDEPENDENT_AMBULATORY_CARE_PROVIDER_SITE_OTHER): Payer: Medicare Other | Admitting: Podiatry

## 2014-05-05 VITALS — BP 153/73 | HR 77 | Resp 16

## 2014-05-05 DIAGNOSIS — Q828 Other specified congenital malformations of skin: Secondary | ICD-10-CM

## 2014-05-05 DIAGNOSIS — B351 Tinea unguium: Secondary | ICD-10-CM

## 2014-05-05 DIAGNOSIS — M79673 Pain in unspecified foot: Secondary | ICD-10-CM

## 2014-05-05 DIAGNOSIS — E1151 Type 2 diabetes mellitus with diabetic peripheral angiopathy without gangrene: Secondary | ICD-10-CM

## 2014-05-05 DIAGNOSIS — L97511 Non-pressure chronic ulcer of other part of right foot limited to breakdown of skin: Secondary | ICD-10-CM

## 2014-05-05 MED ORDER — CEPHALEXIN 500 MG PO CAPS: 500.0000 mg | ORAL_CAPSULE | Freq: Two times a day (BID) | ORAL | Status: DC

## 2014-05-05 NOTE — Progress Notes (Signed)
   Subjective:    Patient ID: Morgan [name], female    DOB: [birth year], 73 y.o.   MRN: [id number]  HPI Comments: "I have a callus my doctor wants looked at"  Patient c/o tender callused area sub 5th MPJ right for several months. The area is darkened. She also would like her feet checked and the nails cut.  Diabetes      Review of Systems  Musculoskeletal: Positive for arthralgias and gait problem.  All other systems reviewed and are negative.      Objective:   Physical Exam        Assessment & Plan:

## 2014-05-05 NOTE — Patient Instructions (Signed)
Diabetes and Foot Care Diabetes may cause you to have problems because of poor blood supply (circulation) to your feet and legs. This may cause the skin on your feet to become thinner, break easier, and heal more slowly. Your skin may become dry, and the skin may peel and crack. You may also have nerve damage in your legs and feet causing decreased feeling in them. You may not notice minor injuries to your feet that could lead to infections or more serious problems. Taking care of your feet is one of the most important things you can do for yourself.  HOME CARE INSTRUCTIONS  Wear shoes at all times, even in the house. Do not go barefoot. Bare feet are easily injured.  Check your feet daily for blisters, cuts, and redness. If you cannot see the bottom of your feet, use a mirror or ask someone for help.  Wash your feet with warm water (do not use hot water) and mild soap. Then pat your feet and the areas between your toes until they are completely dry. Do not soak your feet as this can dry your skin.  Apply a moisturizing lotion or petroleum jelly (that does not contain alcohol and is unscented) to the skin on your feet and to dry, brittle toenails. Do not apply lotion between your toes.  Trim your toenails straight across. Do not dig under them or around the cuticle. File the edges of your nails with an emery board or nail file.  Do not cut corns or calluses or try to remove them with medicine.  Wear clean socks or stockings every day. Make sure they are not too tight. Do not wear knee-high stockings since they may decrease blood flow to your legs.  Wear shoes that fit properly and have enough cushioning. To break in new shoes, wear them for just a few hours a day. This prevents you from injuring your feet. Always look in your shoes before you put them on to be sure there are no objects inside.  Do not cross your legs. This may decrease the blood flow to your feet.  If you find a minor scrape,  cut, or break in the skin on your feet, keep it and the skin around it clean and dry. These areas may be cleansed with mild soap and water. Do not cleanse the area with peroxide, alcohol, or iodine.  When you remove an adhesive bandage, be sure not to damage the skin around it.  If you have a wound, look at it several times a day to make sure it is healing.  Do not use heating pads or hot water bottles. They may burn your skin. If you have lost feeling in your feet or legs, you may not know it is happening until it is too late.  Make sure your health care provider performs a complete foot exam at least annually or more often if you have foot problems. Report any cuts, sores, or bruises to your health care provider immediately. SEEK MEDICAL CARE IF:   You have an injury that is not healing.  You have cuts or breaks in the skin.  You have an ingrown nail.  You notice redness on your legs or feet.  You feel burning or tingling in your legs or feet.  You have pain or cramps in your legs and feet.  Your legs or feet are numb.  Your feet always feel cold. SEEK IMMEDIATE MEDICAL CARE IF:   There is increasing redness,   swelling, or pain in or around a wound.  There is a red line that goes up your leg.  Pus is coming from a wound.  You develop a fever or as directed by your health care provider.  You notice a bad smell coming from an ulcer or wound. Document Released: 05/10/2000 Document Revised: 01/13/2013 Document Reviewed: 10/20/2012 ExitCare Patient Information 2015 [employer]. This information is not intended to replace advice given to you by your health care provider. Make sure you discuss any questions you have with your health care provider.  

## 2014-05-07 NOTE — Progress Notes (Signed)
Subjective:     Patient ID: Morgan DryerFlora M Coutts, female   DOB: [birth year], 73 y.o.   MRN: [id number]  HPI patient presents on referral with a lesion on the plantar aspect right foot that they're concerned could ulcerate or be a problem and also with nail disease 1-5 of both feet that are thick and painful and she cannot cut. Long-term history of diabetes that's not under good control   Review of Systems  All other systems reviewed and are negative.      Objective:   Physical Exam  Constitutional: She is oriented to person, place, and time.  Cardiovascular: Intact distal pulses.   Musculoskeletal: Normal range of motion.  Neurological: She is oriented to person, place, and time.  Skin: Skin is warm and dry.  Nursing note and vitals reviewed.  neurovascular status is diminished bilateral with reduced pulses both DP and PT and also diminished sharp dull and vibratory. Range of motion diminished within the subtalar midtarsal joint with crepitus noted upon full movement of the joint surfaces. Patient is noted to have moderate forefoot instability with keratotic lesion sub-fifth metatarsal that's localized with no proximal edema erythema drainage noted and also noted to have thick yellow brittle nailbeds 1-5 both feet that are painful when palpated     Assessment:     At risk diabetic with nail disease 1-5 both feet and pre-ulcerative callus formation fifth metatarsal right    Plan:     H&P and conditions discussed with patient. I reviewed diabetic foot education and gave her instructions on what to watch and the importance of daily inspections. Debrided the lesion plantar aspect right which has very superficial breakdown with no proximal extension or subcutaneous exposure and advised the patient on soaks and inspections. Debrided nailbeds 1-5 both feet with no iatrogenic bleeding noted and explained that diabetic shoes would be of benefit to this patient. Patient wants diabetic shoes and we will get  approval from her physician and try to prevent her from further ulceration in the future or problems from developing encouraged to call if any changes should occur

## 2014-07-26 ENCOUNTER — Other Ambulatory Visit (HOSPITAL_COMMUNITY): Payer: Self-pay | Admitting: Pulmonary Disease

## 2014-07-26 DIAGNOSIS — Z1231 Encounter for screening mammogram for malignant neoplasm of breast: Secondary | ICD-10-CM

## 2014-08-02 ENCOUNTER — Ambulatory Visit (HOSPITAL_COMMUNITY): Payer: Medicare Other | Attending: Pulmonary Disease

## 2014-08-04 ENCOUNTER — Ambulatory Visit: Payer: Medicare Other | Admitting: Podiatry

## 2014-08-08 ENCOUNTER — Ambulatory Visit: Payer: Medicare Other | Admitting: Diagnostic Neuroimaging

## 2014-08-09 ENCOUNTER — Encounter: Payer: Self-pay | Admitting: Diagnostic Neuroimaging

## 2014-08-17 ENCOUNTER — Ambulatory Visit (INDEPENDENT_AMBULATORY_CARE_PROVIDER_SITE_OTHER): Payer: Medicare Other | Admitting: Diagnostic Neuroimaging

## 2014-08-17 ENCOUNTER — Encounter: Payer: Self-pay | Admitting: Diagnostic Neuroimaging

## 2014-08-17 VITALS — BP 151/83 | HR 76 | Ht 64.0 in | Wt 154.0 lb

## 2014-08-17 DIAGNOSIS — R269 Unspecified abnormalities of gait and mobility: Secondary | ICD-10-CM | POA: Diagnosis not present

## 2014-08-17 DIAGNOSIS — G35 Multiple sclerosis: Secondary | ICD-10-CM | POA: Diagnosis not present

## 2014-08-17 NOTE — Patient Instructions (Signed)
Please stop driving.  Use rollator walker (with bench and brakes).  I will set up home PT evaluation.

## 2014-08-17 NOTE — Progress Notes (Signed)
[facility]  PATIENT: Morgan [name] DOB: 1940/07/16  REFERRING CLINICIAN: Kilpatrick HISTORY FROM: patient  REASON FOR VISIT: follow up    HISTORICAL  CHIEF COMPLAINT:  Chief Complaint  Patient presents with  . Follow-up    Multiple Sclerosis     HISTORY OF PRESENT ILLNESS:   UPDATE 08/17/14: Since last visit, she now off prednisone. Using cane. Unsteady gait. Still drives to grocery store (and to today's visit). Does not want to consider starting MS medications. Living with her son.  UPDATE 02/04/14: Since last visit, walking and balance much better. Now back to baseline gait. However, numbness is hands is slightly improved but not resolved. Also, had lost her insurance and couldn't get MRIs or home PT. Now has insurance again.  UPDATE 11/01/13: Since last visit, was doing well until 2 months ago. He brother passed away, and soon after, she was having more bilateral weakness in legs/body, with bilateral arm numbness. She went to PCP who started her on prednisone  daily, and she has continued on this for past 2 months. She feels a little better. Still having to use a cane and walk very slowly.   PRIOR HPI (06/04/12, Dr. Sandria Manly): 74 year old right-handed African American married female from [place], [place] with a history of transverse myelitis in 1976 and documented periventricular white matter lesions on MRI of the brain with and without contrast 11/10/01 and abnormal cord signal from C3-C7 11/10/01. She was first seen by Dr. Maximino Greenland in 1977 with transverse myelitis and next seen by him in 1987. MRI study of the brain in 1987 was compatible with multiple sclerosis.  She has never been on immunomodulation treatment. She denies bowel or bladder dysfunction, Lhermitte's sign, or visual loss. 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 Occasionally she had memory problems and slurred speech.  She had 2 falls last year but none in the last 6 months. 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  She didn't activities of daily living and drives a car.  She notes left-sided facial  numbness when talking that lasts a few minutes about once per week.It involves the one in V2.  She feels tired.  When she does she takes a nap.  She is cooking for one week once per month and a group home.  She enjoys working with mentally handicapped. She has a one-time skin rash over her hands. She notes right anterior thigh numbness which comes and goes.   REVIEW OF SYSTEMS: Full 14 system review of systems performed and notable only as per HPI. Otherwise negative.  ALLERGIES: Allergies  Allergen Reactions  . Ibuprofen Rash    HOME MEDICATIONS: Outpatient Prescriptions Prior to Visit  Medication Sig Dispense Refill  . baclofen (LIORESAL) 10 MG tablet Take 1 tablet by mouth at bedtime.    . TRAVATAN Z 0.004 % SOLN ophthalmic solution Place 1 drop into both eyes at bedtime.    . Carvedilol (COREG PO) Take by mouth.    . cephALEXin (KEFLEX) 500 MG capsule Take 1 capsule (500 mg total) by mouth 2 (two) times daily. 20 capsule 1  . Cyclobenzaprine HCl (FLEXERIL PO) Take by mouth.    . TRAMADOL HCL PO Take by mouth.    . TRIAMTERENE-HCTZ PO Take by mouth.  No facility-administered medications prior to visit.    PAST MEDICAL HISTORY: Past Medical History  Diagnosis Date  . Hypertension   . Diabetes mellitus without complication   . Multiple sclerosis     PAST SURGICAL HISTORY: Past Surgical History  Procedure Laterality Date  . Abdominal hysterectomy    . Bladder surgery      FAMILY HISTORY: Family History  Problem Relation Age of Onset  . Stomach cancer Mother   . Pneumonia Father   . Cancer Father     prostate, lung    SOCIAL  HISTORY:  History   Social History  . Marital Status: Widowed    Spouse Name: N/A  . Number of Children: 2  . Years of Education: BS   Occupational History  . Retired Other   Social History Main Topics  . Smoking status: Never Smoker   . Smokeless tobacco: Never Used  . Alcohol Use: No  . Drug Use: No  . Sexual Activity: No   Other Topics Concern  . Not on file   Social History Narrative   Patient lives at home with son   Caffeine Use 1/2 cup of coffee a day and drinks a fair amount of tea      PHYSICAL EXAM  Filed Vitals:   08/17/14 1304  BP: 151/83  Pulse: 76  Height: 5\' 4"  (1.626 m)  Weight: 154 lb (69.854 kg)    Not recorded      Body mass index is 26.42 kg/(m^2).  GENERAL EXAM: Patient is in no distress; well developed, nourished and groomed; neck is supple  CARDIOVASCULAR: Regular rate and rhythm, no murmurs, no carotid bruits  NEUROLOGIC: MENTAL STATUS: awake, alert, language fluent, comprehension intact, naming intact, fund of knowledge appropriate CRANIAL NERVE: pupils equal and reactive to light, visual fields full to confrontation, extraocular muscles intact, no nystagmus, facial sensation and strength symmetric, hearing intact, palate elevates symmetrically, uvula midline, shoulder shrug symmetric, tongue midline. MOTOR: BUE 4, BLE 4; INCREASED TONE IN BLE; SIG BRADYKINESIA IN BLE SENSORY: DECR VIB AND PP IN FEET COORDINATION: finger-nose-finger, fine finger movements normal REFLEXES: deep tendon reflexes BRISK and symmetric GAIT/STATION: SLOW UNSTEADY GAIT. SLOW TO RISE. USES SINGLE POINT CANE. HEAD DOWN. STOOPED POSTURE. VERY UNSTEADY.     DIAGNOSTIC DATA (LABS, IMAGING, TESTING) - I reviewed patient records, labs, notes, testing and imaging myself where available.  No results found for: WBC No results found for: NA No results found for: CHOL No results found for: HGBA1C No results found for: VITAMINB12 No results found for: TSH  I  reviewed images myself and agree with interpretation. -VRP  04/16/11 CT head - severe periventricular and subcortical white matter dz    ASSESSMENT AND PLAN  74 y.o. year old female here with multiple sclerosis since 1977, never on disease modifying therapy. Now with possible MS exacerbation in April 2015. Symptoms have spontaneously improved. She is skeptical of multiple sclerosis disease modifying therapy and wants to avoid any new medications.   PLAN: - transition away from driving (due to postural instability, decr reaction time, spastic paraparesis, head drop) - home PT for gait training/safety eval - use rollator walker  Return in about 6 months (around 02/17/2015).  I spent 25 minutes of face to face time with patient. Greater than 50% of time was spent in counseling and coordination of care with patient.    Suanne Marker, MD 08/17/2014, 1:33 PM Certified in Neurology, Neurophysiology and Neuroimaging  [facility] [address] [place], [place] [address] [phone]

## 2014-08-24 ENCOUNTER — Ambulatory Visit (HOSPITAL_COMMUNITY): Payer: Medicare Other | Attending: Pulmonary Disease

## 2014-09-05 ENCOUNTER — Telehealth: Payer: Self-pay | Admitting: Neurology

## 2014-09-05 NOTE — Telephone Encounter (Signed)
Erin with [facility] is calling to get a verbal approval to extend therapy for 2 more weeks.  Please call.

## 2014-11-12 ENCOUNTER — Emergency Department (HOSPITAL_COMMUNITY)
Admission: EM | Admit: 2014-11-12 | Discharge: 2014-11-13 | Disposition: A | Discharge status: Home / Self Care | Payer: Medicare Other | Attending: Emergency Medicine | Admitting: Emergency Medicine

## 2014-11-12 DIAGNOSIS — N3090 Cystitis, unspecified without hematuria: Secondary | ICD-10-CM

## 2014-11-12 DIAGNOSIS — Z79899 Other long term (current) drug therapy: Secondary | ICD-10-CM | POA: Insufficient documentation

## 2014-11-12 DIAGNOSIS — I10 Essential (primary) hypertension: Secondary | ICD-10-CM | POA: Diagnosis not present

## 2014-11-12 DIAGNOSIS — E119 Type 2 diabetes mellitus without complications: Secondary | ICD-10-CM | POA: Diagnosis not present

## 2014-11-12 DIAGNOSIS — Z7952 Long term (current) use of systemic steroids: Secondary | ICD-10-CM | POA: Diagnosis not present

## 2014-11-12 DIAGNOSIS — G35 Multiple sclerosis: Secondary | ICD-10-CM

## 2014-11-12 NOTE — ED Notes (Signed)
Bed: VV61 Expected date:  Expected time:  Means of arrival:  Comments: EMS 47F MS Flare up

## 2014-11-13 ENCOUNTER — Encounter (HOSPITAL_COMMUNITY): Payer: Self-pay | Admitting: Emergency Medicine

## 2014-11-13 ENCOUNTER — Emergency Department (HOSPITAL_COMMUNITY): Payer: Medicare Other

## 2014-11-13 LAB — CBC WITH DIFFERENTIAL/PLATELET
BASOS ABS: 0 10*3/uL (ref 0.0–0.1)
Basophils Relative: 0 % (ref 0–1)
EOS PCT: 1 % (ref 0–5)
Eosinophils Absolute: 0.1 10*3/uL (ref 0.0–0.7)
HCT: 41.1 % (ref 36.0–46.0)
HEMOGLOBIN: 13.3 g/dL (ref 12.0–15.0)
LYMPHS PCT: 15 % (ref 12–46)
Lymphs Abs: 1.6 10*3/uL (ref 0.7–4.0)
MCH: 27 pg (ref 26.0–34.0)
MCHC: 32.4 g/dL (ref 30.0–36.0)
MCV: 83.5 fL (ref 78.0–100.0)
Monocytes Absolute: 1.7 10*3/uL — ABNORMAL HIGH (ref 0.1–1.0)
Monocytes Relative: 16 % — ABNORMAL HIGH (ref 3–12)
NEUTROS PCT: 68 % (ref 43–77)
Neutro Abs: 7.2 10*3/uL (ref 1.7–7.7)
Platelets: 259 10*3/uL (ref 150–400)
RBC: 4.92 MIL/uL (ref 3.87–5.11)
RDW: 13.2 % (ref 11.5–15.5)
WBC: 10.6 10*3/uL — ABNORMAL HIGH (ref 4.0–10.5)

## 2014-11-13 LAB — BASIC METABOLIC PANEL
ANION GAP: 10 (ref 5–15)
BUN: 24 mg/dL — AB (ref 6–20)
CALCIUM: 9.4 mg/dL (ref 8.9–10.3)
CO2: 30 mmol/L (ref 22–32)
CREATININE: 0.98 mg/dL (ref 0.44–1.00)
Chloride: 96 mmol/L — ABNORMAL LOW (ref 101–111)
GFR calc Af Amer: >60 mL/min (ref >60)
GFR, EST NON AFRICAN AMERICAN: 55 mL/min — AB (ref >60)
GLUCOSE: 132 mg/dL — AB (ref 65–99)
Potassium: 3.7 mmol/L (ref 3.5–5.1)
SODIUM: 136 mmol/L (ref 135–145)

## 2014-11-13 LAB — URINALYSIS, ROUTINE W REFLEX MICROSCOPIC
Bilirubin Urine: NEGATIVE
GLUCOSE, UA: NEGATIVE mg/dL
HGB URINE DIPSTICK: NEGATIVE
Ketones, ur: NEGATIVE mg/dL
Nitrite: NEGATIVE
PH: 6 (ref 5.0–8.0)
PROTEIN: NEGATIVE mg/dL
SPECIFIC GRAVITY, URINE: 1.02 (ref 1.005–1.030)
UROBILINOGEN UA: 1 mg/dL (ref 0.0–1.0)

## 2014-11-13 LAB — URINE MICROSCOPIC-ADD ON

## 2014-11-13 MED ORDER — CEPHALEXIN 500 MG PO CAPS: 500.0000 mg | ORAL_CAPSULE | Freq: Four times a day (QID) | ORAL | Status: DC

## 2014-11-13 MED ORDER — CEFPODOXIME PROXETIL 200 MG PO TABS: 100.0000 mg | ORAL_TABLET | Freq: Once | ORAL | Status: DC

## 2014-11-13 MED ORDER — SODIUM CHLORIDE 0.9 % IV BOLUS (SEPSIS)
1000.0000 mL | Freq: Once | INTRAVENOUS | Status: AC
Start: 2014-11-13 — End: 2014-11-13
  Administered 2014-11-13: 1000 mL via INTRAVENOUS

## 2014-11-13 MED ORDER — CEPHALEXIN 500 MG PO CAPS
500.0000 mg | ORAL_CAPSULE | Freq: Once | ORAL | Status: AC
  Administered 2014-11-13: 500 mg via ORAL
  Filled 2014-11-13: qty 1

## 2014-11-13 NOTE — ED Notes (Signed)
Pt's brother would like to be called so she can be transported home.  Charles: [phone]  [place]: [phone]

## 2014-11-13 NOTE — ED Notes (Signed)
Pt transported to xray 

## 2014-11-13 NOTE — Discharge Instructions (Signed)
Multiple Sclerosis  Multiple sclerosis (MS) is a disease of the central nervous system. It leads to the loss of the insulating covering of the nerves (myelin sheath) of your brain. When this happens, brain signals do not get sent properly or may not get sent at all. The age of onset of MS varies.   CAUSES  The cause of MS is unknown. However, it is more common in the [place] than in the southern [place].  RISK FACTORS  There is a higher number of women with MS than men. MS is not an illness that is passed down to you from your family members (inherited). However, your risk of MS is higher if you have a relative with MS.  SIGNS AND SYMPTOMS   The symptoms of MS occur in episodes or attacks. These attacks may last weeks to months. There may be long periods of almost no symptoms between attacks. The symptoms of MS vary. This is because of the many different ways it affects the central nervous system. The main symptoms of MS include:   Vision problems and eye pain.   Numbness.   Weakness.   Inability to move your arms, hands, feet, or legs (paralysis).   Balance problems.   Tremors.  DIAGNOSIS   Your health care provider can diagnose MS with the help of imaging exams and lab tests. These may include specialized X-ray exams and spinal fluid tests. The best imaging exam to confirm a diagnosis of MS is an MRI.  TREATMENT   There is no known cure for MS, but there are medicines that can decrease the number and frequency of attacks. Steroids are often used for short-term relief. Physical and occupational therapy may also help. There are also many new alternative or complementary treatments available to help control the symptoms of MS. Ask your health care provider if any of these other options are right for you.  HOME CARE INSTRUCTIONS    Take medicines as directed by your health care provider.   Exercise as directed by your health care provider.  SEEK MEDICAL CARE IF:  You begin to feel  depressed.  SEEK IMMEDIATE MEDICAL CARE IF:   You develop paralysis.   You have problems with bladder, bowel, or sexual function.   You develop mental changes, such as forgetfulness or mood swings.   You have a period of uncontrolled movements (seizure).  Document Released: 05/10/2000 Document Revised: 05/18/2013 Document Reviewed: 01/18/2013  ExitCare Patient Information 2015 [employer]. This information is not intended to replace advice given to you by your health care provider. Make sure you discuss any questions you have with your health care provider.            Urinary Tract Infection  Urinary tract infections (UTIs) can develop anywhere along your urinary tract. Your urinary tract is your body's drainage system for removing wastes and extra water. Your urinary tract includes two kidneys, two ureters, a bladder, and a urethra. Your kidneys are a pair of bean-shaped organs. Each kidney is about the size of your fist. They are located below your ribs, one on each side of your spine.  CAUSES  Infections are caused by microbes, which are microscopic organisms, including fungi, viruses, and bacteria. These organisms are so small that they can only be seen through a microscope. Bacteria are the microbes that most commonly cause UTIs.  SYMPTOMS   Symptoms of UTIs may vary by age and gender of the patient and by the location   of the infection. Symptoms in young women typically include a frequent and intense urge to urinate and a painful, burning feeling in the bladder or urethra during urination. Older women and men are more likely to be tired, shaky, and weak and have muscle aches and abdominal pain. A fever may mean the infection is in your kidneys. Other symptoms of a kidney infection include pain in your back or sides below the ribs, nausea, and vomiting.  DIAGNOSIS  To diagnose a UTI, your caregiver will ask you about your symptoms. Your caregiver also will ask to provide a urine sample. The urine sample  will be tested for bacteria and white blood cells. White blood cells are made by your body to help fight infection.  TREATMENT   Typically, UTIs can be treated with medication. Because most UTIs are caused by a bacterial infection, they usually can be treated with the use of antibiotics. The choice of antibiotic and length of treatment depend on your symptoms and the type of bacteria causing your infection.  HOME CARE INSTRUCTIONS   If you were prescribed antibiotics, take them exactly as your caregiver instructs you. Finish the medication even if you feel better after you have only taken some of the medication.   Drink enough water and fluids to keep your urine clear or pale yellow.   Avoid caffeine, tea, and carbonated beverages. They tend to irritate your bladder.   Empty your bladder often. Avoid holding urine for long periods of time.   Empty your bladder before and after sexual intercourse.   After a bowel movement, women should cleanse from front to back. Use each tissue only once.  SEEK MEDICAL CARE IF:    You have back pain.   You develop a fever.   Your symptoms do not begin to resolve within 3 days.  SEEK IMMEDIATE MEDICAL CARE IF:    You have severe back pain or lower abdominal pain.   You develop chills.   You have nausea or vomiting.   You have continued burning or discomfort with urination.  MAKE SURE YOU:    Understand these instructions.   Will watch your condition.   Will get help right away if you are not doing well or get worse.  Document Released: 02/20/2005 Document Revised: 11/12/2011 Document Reviewed: 06/21/2011  ExitCare Patient Information 2015 [employer]. This information is not intended to replace advice given to you by your health care provider. Make sure you discuss any questions you have with your health care provider.

## 2014-11-13 NOTE — ED Notes (Signed)
Pt arrived to the ED via EMS with a complaint of generalized body pain.  Pt has a history of MS.  Pt believes this is a MS flare up.  Pt has experienced symptoms for two days.

## 2014-11-13 NOTE — ED Notes (Signed)
Two unsuccessful attempts at IV and lab collection.

## 2014-11-13 NOTE — ED Provider Notes (Addendum)
CSN: [id number]     Arrival date & time 11/12/14  2348 History   First MD Initiated Contact with Patient 11/12/14 2350     Chief Complaint  Patient presents with  . Multiple Sclerosis     (Consider location/radiation/quality/duration/timing/severity/associated sxs/prior Treatment) Patient is a 74 y.o. female presenting with general illness.  Illness Location:  Generalized Quality:  Slowing, ataxia Severity:  Moderate Onset quality:  Gradual Duration:  2 days Timing:  Constant Progression:  Worsening Chronicity:  Chronic Context:  Ho MS with chronic head droop Relieved by:  Nothing Worsened by:  Nothing Associated symptoms: no abdominal pain, no chest pain, no fever, no myalgias, no nausea, no shortness of breath and no vomiting     Past Medical History  Diagnosis Date  . Hypertension   . Diabetes mellitus without complication   . Multiple sclerosis    Past Surgical History  Procedure Laterality Date  . Abdominal hysterectomy    . Bladder surgery     Family History  Problem Relation Age of Onset  . Stomach cancer Mother   . Pneumonia Father   . Cancer Father     prostate, lung   History  Substance Use Topics  . Smoking status: Never Smoker   . Smokeless tobacco: Never Used  . Alcohol Use: No   OB History    No data available     Review of Systems  Constitutional: Negative for fever.  Respiratory: Negative for shortness of breath.   Cardiovascular: Negative for chest pain.  Gastrointestinal: Negative for nausea, vomiting and abdominal pain.  Musculoskeletal: Negative for myalgias.  All other systems reviewed and are negative.     Allergies  Ibuprofen  Home Medications   Prior to Admission medications   Medication Sig Start Date End Date Taking? Authorizing Provider  baclofen (LIORESAL) 10 MG tablet Take 1 tablet by mouth 2 (two) times daily.  01/17/14  Yes Historical Provider, MD  carvedilol (COREG) 6.25 MG tablet Take 6.25 mg by mouth 2 (two)  times daily. 08/12/14  Yes Historical Provider, MD  metFORMIN (GLUMETZA) 500 MG (MOD) 24 hr tablet Take 500 mg by mouth 2 (two) times daily with a meal. Depending on blood sugar readings   Yes Historical Provider, MD  predniSONE (DELTASONE) 10 MG tablet Take 10 mg by mouth daily with breakfast.   Yes Historical Provider, MD  traMADol (ULTRAM) 50 MG tablet Take 50-100 mg by mouth 2 (two) times daily.  07/27/14  Yes Historical Provider, MD  TRAVATAN Z 0.004 % SOLN ophthalmic solution Place 1 drop into both eyes at bedtime. 01/27/14  Yes Historical Provider, MD  triamterene-hydrochlorothiazide (MAXZIDE-25) 37.5-25 MG per tablet Take 1 tablet by mouth daily. 06/23/14  Yes Historical Provider, MD  Vitamin D, Ergocalciferol, (DRISDOL) 50000 UNITS CAPS capsule Take 50,000 Units by mouth once a week. Monday 07/20/14  Yes Historical Provider, MD  cephALEXin (KEFLEX) 500 MG capsule Take 1 capsule (500 mg total) by mouth 4 (four) times daily. 11/13/14   Mirian Mo, MD   BP 144/86 mmHg  Pulse 90  Temp(Src) 98.8 F (37.1 C) (Oral)  Resp 18  SpO2 97% Physical Exam  Constitutional: She is oriented to person, place, and time. She appears well-developed and well-nourished.  HENT:  Head: Normocephalic and atraumatic.  Right Ear: External ear normal.  Left Ear: External ear normal.  Eyes: Conjunctivae and EOM are normal. Pupils are equal, round, and reactive to light.  Neck: Normal range of motion. Neck supple.  Cardiovascular: Normal  rate, regular rhythm, normal heart sounds and intact distal pulses.   Pulmonary/Chest: Effort normal and breath sounds normal.  Abdominal: Soft. Bowel sounds are normal. There is no tenderness.  Musculoskeletal: Normal range of motion.  Neurological: She is alert and oriented to person, place, and time.  Head droop, EOMI, strength intact bilaterally  Skin: Skin is warm and dry.  Vitals reviewed.   ED Course  Procedures (including critical care time) Labs Review Labs  Reviewed  CBC WITH DIFFERENTIAL/PLATELET - Abnormal; Notable for the following:    WBC 10.6 (*)    Monocytes Relative 16 (*)    Monocytes Absolute 1.7 (*)    All other components within normal limits  BASIC METABOLIC PANEL - Abnormal; Notable for the following:    Chloride 96 (*)    Glucose, Bld 132 (*)    BUN 24 (*)    GFR calc non Af Amer 55 (*)    All other components within normal limits  URINALYSIS, ROUTINE W REFLEX MICROSCOPIC (NOT AT [facility]) - Abnormal; Notable for the following:    Leukocytes, UA SMALL (*)    All other components within normal limits  URINE MICROSCOPIC-ADD ON - Abnormal; Notable for the following:    Squamous Epithelial / LPF FEW (*)    Bacteria, UA FEW (*)    All other components within normal limits    Imaging Review Dg Chest 2 View  11/13/2014   CLINICAL DATA:  Generalized body pain. History of multiple sclerosis. Symptoms for 2 days.  EXAM: CHEST  2 VIEW  COMPARISON:  None.  FINDINGS: Shallow inspiration. Cervical thoracic kyphosis and degenerative change. Heart size and pulmonary vascularity appear normal. Calcified and tortuous aorta. No focal airspace disease or consolidation in the lungs. No blunting of costophrenic angles. Degenerative changes in the shoulders.  IMPRESSION: No evidence of active pulmonary disease.   Electronically Signed   By: Burman Nieves M.D.   On: 11/13/2014 00:59     EKG Interpretation None     Emergency Ultrasound Study:   Angiocath insertion Performed by: Mirian Mo  Consent: Verbal consent obtained. Risks and benefits: risks, benefits and alternatives were discussed Immediately prior to procedure the correct patient, procedure, equipment, support staff and site/side marked as needed.  Indication: difficult IV access Preparation: Patient was prepped and draped in the usual sterile fashion. Vein Location: L brachial vein was visualized during assessment for potential access sites and was found to be patent/ easily  compressed with linear ultrasound.  The needle was visualized with real-time ultrasound and guided into the vein. Gauge: 20  Image saved and stored.  Normal blood return.  Patient tolerance: Patient tolerated the procedure well with no immediate complications.     MDM   Final diagnoses:  Cystitis  Multiple sclerosis    74 y.o. female with pertinent PMH of MS, HTN, DM presents with acute on chronic gait instability, generalized weakness.  She states this feels like an MS flare.  She denies fever, dysuria, gi symptoms.  Physical exam as above.  Wu with UTI.  I spoke with the neurologist on call and plan was to treat UTI prior to admitting for high dose steroid with plan to dc home and have pt fu with her neurologist.  I spoke with the pt who was very happy with this plan and states she can ambulate at her baseline at this time.  DC home in stable condition.    I have reviewed all laboratory and imaging studies if ordered  as above  1. Cystitis   2. Multiple sclerosis         Mirian Mo, MD 11/13/14 1610  Mirian Mo, MD 11/18/14 [phone]

## 2015-01-27 ENCOUNTER — Ambulatory Visit (HOSPITAL_COMMUNITY): Payer: Medicare Other

## 2015-02-21 ENCOUNTER — Ambulatory Visit: Payer: Medicare Other | Admitting: Diagnostic Neuroimaging

## 2015-02-22 ENCOUNTER — Encounter: Payer: Self-pay | Admitting: Diagnostic Neuroimaging

## 2015-05-17 ENCOUNTER — Encounter: Payer: Self-pay | Admitting: Diagnostic Neuroimaging

## 2015-05-17 ENCOUNTER — Ambulatory Visit (INDEPENDENT_AMBULATORY_CARE_PROVIDER_SITE_OTHER): Payer: Medicare Other | Admitting: Diagnostic Neuroimaging

## 2015-05-17 VITALS — BP 136/80 | HR 88 | Ht 64.0 in | Wt 142.0 lb

## 2015-05-17 DIAGNOSIS — G35 Multiple sclerosis: Secondary | ICD-10-CM | POA: Diagnosis not present

## 2015-05-17 NOTE — Progress Notes (Signed)
[facility]  PATIENT: Morgan [name] DOB: December 24, 1940  REFERRING CLINICIAN: Kilpatrick HISTORY FROM: patient  REASON FOR VISIT: follow up    HISTORICAL  CHIEF COMPLAINT:  Chief Complaint  Patient presents with  . Multiple Sclerosis    rm 6  . Follow-up    9 months since last OV    HISTORY OF PRESENT ILLNESS:   UPDATE 05/17/15: Since last visit, MS symptoms are stable. Still falling frequently. Tragically her daughter (age 76 yrs) died 3 months ago due to lupus and plasma cell leukemia.  UPDATE 08/17/14: Since last visit, she now off prednisone. Using cane. Unsteady gait. Still drives to grocery store (and to today's visit). Does not want to consider starting MS medications. Living with her son.  UPDATE 02/04/14: Since last visit, walking and balance much better. Now back to baseline gait. However, numbness is hands is slightly improved but not resolved. Also, had lost her insurance and couldn't get MRIs or home PT. Now has insurance again.  UPDATE 11/01/13: Since last visit, was doing well until 2 months ago. He brother passed away, and soon after, she was having more bilateral weakness in legs/body, with bilateral arm numbness. She went to PCP who started her on prednisone  daily, and she has continued on this for past 2 months. She feels a little better. Still having to use a cane and walk very slowly.   PRIOR HPI (06/04/12, Dr. Sandria Manly): 74 year old right-handed African American married female from [place], [place] with a history of transverse myelitis in 1976 and documented periventricular white matter lesions on MRI of the brain with and without contrast 11/10/01 and abnormal cord signal from C3-C7 11/10/01. She was first seen by Dr. Maximino Greenland in 1977 with transverse myelitis and next seen by him in 1987. MRI study of the brain in 1987 was compatible with multiple sclerosis.  She has never been on immunomodulation treatment. She denies bowel or bladder  dysfunction, Lhermitte's sign, or visual loss. She has formerly been an Community education officer. She walks without a cane or walker.She has diabetes mellitus treated with metformin 500 mg once daily with capillary blood glucoses in the 90-100 range.She is independent in her activities of daily living. Occasionally she had memory problems and slurred speech.  She had 2 falls last year but none in the last 6 months. 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  She didn't activities of daily living and drives a car.  She notes left-sided facial  numbness when talking that lasts a few minutes about once per week.It involves the one in V2.  She feels tired.  When she does she takes a nap.  She is cooking for one week once per month and a group home.  She enjoys working with mentally handicapped. She has a one-time skin rash over her hands. She notes right anterior thigh numbness which comes and goes.   REVIEW OF SYSTEMS: Full 14 system review of systems performed and notable only as per HPI. Otherwise negative.  ALLERGIES: Allergies  Allergen Reactions  . Ibuprofen Rash    HOME MEDICATIONS: Outpatient Prescriptions Prior to Visit  Medication Sig Dispense Refill  . baclofen (LIORESAL) 10 MG tablet Take 1 tablet by mouth 2 (two) times daily.     . carvedilol (COREG) 6.25 MG tablet Take 6.25 mg by mouth 2 (two) times daily.  3  . metFORMIN (GLUMETZA) 500 MG (MOD) 24 hr tablet Take 500 mg by mouth 2 (two)  times daily with a meal. Depending on blood sugar readings    . traMADol (ULTRAM) 50 MG tablet Take 50-100 mg by mouth 2 (two) times daily.   4  . TRAVATAN Z 0.004 % SOLN ophthalmic solution Place 1 drop into both eyes at bedtime.    . triamterene-hydrochlorothiazide (MAXZIDE-25) 37.5-25 MG per tablet Take 1 tablet by mouth daily.  4  . Vitamin D, Ergocalciferol, (DRISDOL) 50000 UNITS CAPS capsule Take 50,000 Units by mouth once a week. Monday  4  . cephALEXin (KEFLEX) 500 MG capsule Take 1  capsule (500 mg total) by mouth 4 (four) times daily. 28 capsule 0  . predniSONE (DELTASONE) 10 MG tablet Take 10 mg by mouth daily with breakfast.     No facility-administered medications prior to visit.    PAST MEDICAL HISTORY: Past Medical History  Diagnosis Date  . Hypertension   . Diabetes mellitus without complication (HCC)   . Multiple sclerosis (HCC)     PAST SURGICAL HISTORY: Past Surgical History  Procedure Laterality Date  . Abdominal hysterectomy    . Bladder surgery      FAMILY HISTORY: Family History  Problem Relation Age of Onset  . Stomach cancer Mother   . Pneumonia Father   . Cancer Father     prostate, lung    SOCIAL HISTORY:  Social History   Social History  . Marital Status: Widowed    Spouse Name: N/A  . Number of Children: 2  . Years of Education: BS   Occupational History  . Retired Other   Social History Main Topics  . Smoking status: Never Smoker   . Smokeless tobacco: Never Used  . Alcohol Use: No  . Drug Use: No  . Sexual Activity: No   Other Topics Concern  . Not on file   Social History Narrative   Patient lives at home with son   Caffeine Use 1/2 cup of coffee a day and drinks a fair amount of tea      PHYSICAL EXAM  Filed Vitals:   05/17/15 1428  BP: 136/80  Pulse: 88  Height: 5\' 4"  (1.626 m)  Weight: 142 lb (64.411 kg)    Not recorded      Body mass index is 24.36 kg/(m^2).  GENERAL EXAM: Patient is in no distress; well developed, nourished and groomed; neck is supple  CARDIOVASCULAR: Regular rate and rhythm, no murmurs, no carotid bruits  NEUROLOGIC: MENTAL STATUS: awake, alert, language fluent, comprehension intact, naming intact, fund of knowledge appropriate CRANIAL NERVE: pupils equal and reactive to light, visual fields full to confrontation, extraocular muscles intact, no nystagmus, facial sensation and strength symmetric, hearing intact, palate elevates symmetrically, uvula midline, shoulder  shrug symmetric, tongue midline. MOTOR: BUE 4, BLE 4; INCREASED TONE IN BLE; SIG BRADYKINESIA IN BLE SENSORY: DECR VIB AND PP IN FEET COORDINATION: finger-nose-finger, fine finger movements normal REFLEXES: deep tendon reflexes BRISK and symmetric GAIT/STATION: SLOW UNSTEADY GAIT. SLOW TO RISE. USES SINGLE POINT CANE. HEAD DOWN. STOOPED POSTURE. VERY UNSTEADY.     DIAGNOSTIC DATA (LABS, IMAGING, TESTING) - I reviewed patient records, labs, notes, testing and imaging myself where available.  Lab Results  Component Value Date   WBC 10.6* 11/13/2014      Component Value Date/Time   NA 136 11/13/2014 0149   No results found for: CHOL No results found for: HGBA1C No results found for: VITAMINB12 No results found for: TSH   04/16/11 CT head - severe periventricular and subcortical white matter  dz    ASSESSMENT AND PLAN  74 y.o. year old female here with multiple sclerosis since 1977, never on disease modifying therapy. Now with possible MS exacerbation in April 2015. Symptoms have spontaneously improved. She is skeptical of multiple sclerosis disease modifying therapy and wants to avoid any new medications.   PLAN: - no further testing or treatment advised - recommend palliative care consult if patient agrees  Return in about 6 months (around 11/15/2015), or if symptoms worsen or fail to improve, for return to PCP.    Suanne Marker, MD 05/17/2015, 2:58 PM Certified in Neurology, Neurophysiology and Neuroimaging  [facility] [address] [place], [place] [address] [phone]

## 2015-05-17 NOTE — Patient Instructions (Signed)
-   continue current medications  - consider palliative care consult

## 2015-11-23 ENCOUNTER — Other Ambulatory Visit: Payer: Self-pay | Admitting: Pulmonary Disease

## 2015-11-23 DIAGNOSIS — E1151 Type 2 diabetes mellitus with diabetic peripheral angiopathy without gangrene: Secondary | ICD-10-CM | POA: Diagnosis not present

## 2015-11-23 DIAGNOSIS — Z1231 Encounter for screening mammogram for malignant neoplasm of breast: Secondary | ICD-10-CM

## 2015-11-23 DIAGNOSIS — E559 Vitamin D deficiency, unspecified: Secondary | ICD-10-CM | POA: Diagnosis not present

## 2015-11-23 DIAGNOSIS — I999 Unspecified disorder of circulatory system: Secondary | ICD-10-CM | POA: Diagnosis not present

## 2015-11-23 DIAGNOSIS — Z79899 Other long term (current) drug therapy: Secondary | ICD-10-CM | POA: Diagnosis not present

## 2015-11-23 DIAGNOSIS — M2550 Pain in unspecified joint: Secondary | ICD-10-CM | POA: Diagnosis not present

## 2015-11-23 DIAGNOSIS — Z6821 Body mass index (BMI) 21.0-21.9, adult: Secondary | ICD-10-CM | POA: Diagnosis not present

## 2015-11-23 DIAGNOSIS — E1121 Type 2 diabetes mellitus with diabetic nephropathy: Secondary | ICD-10-CM | POA: Diagnosis not present

## 2015-11-23 DIAGNOSIS — G35 Multiple sclerosis: Secondary | ICD-10-CM | POA: Diagnosis not present

## 2015-11-23 DIAGNOSIS — M1990 Unspecified osteoarthritis, unspecified site: Secondary | ICD-10-CM | POA: Diagnosis not present

## 2015-11-29 ENCOUNTER — Ambulatory Visit: Payer: Medicare Other

## 2016-01-12 IMAGING — CR DG CHEST 2V
3 series · 3 of 3 positions shown · non-contrast
Comparison: None.

CLINICAL DATA: Generalized body pain. History of multiple
sclerosis. Symptoms for 2 days.

EXAM:
CHEST  2 VIEW

[w chest lat]
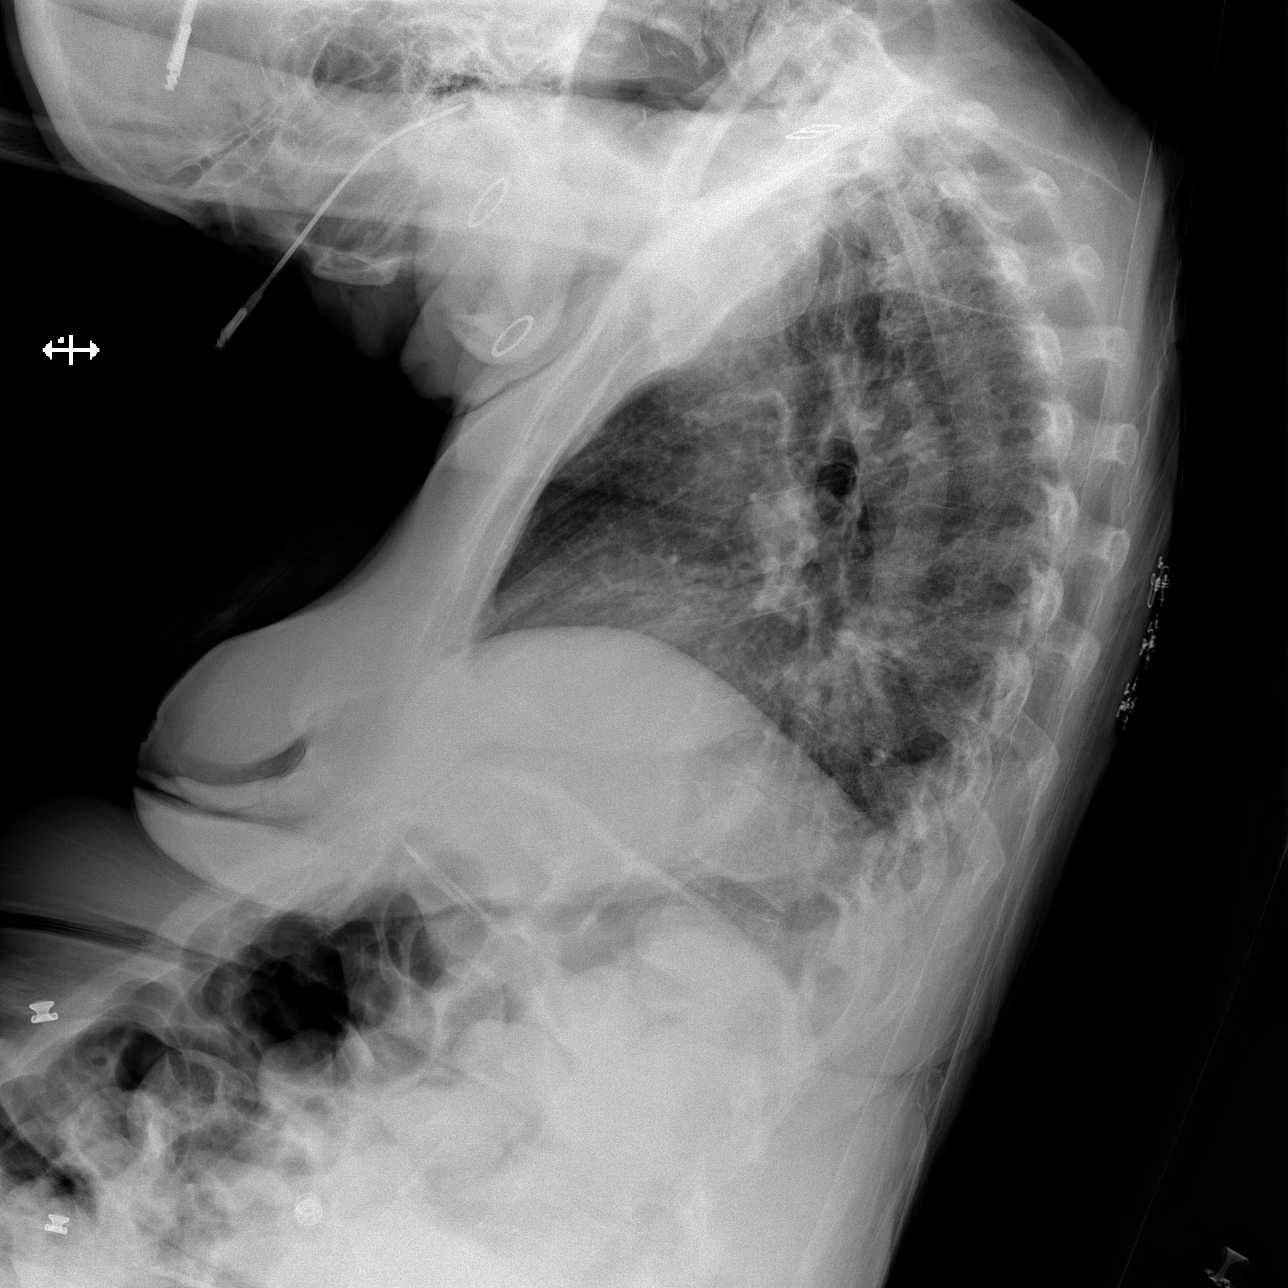

[x chest ap (1 of 2)]
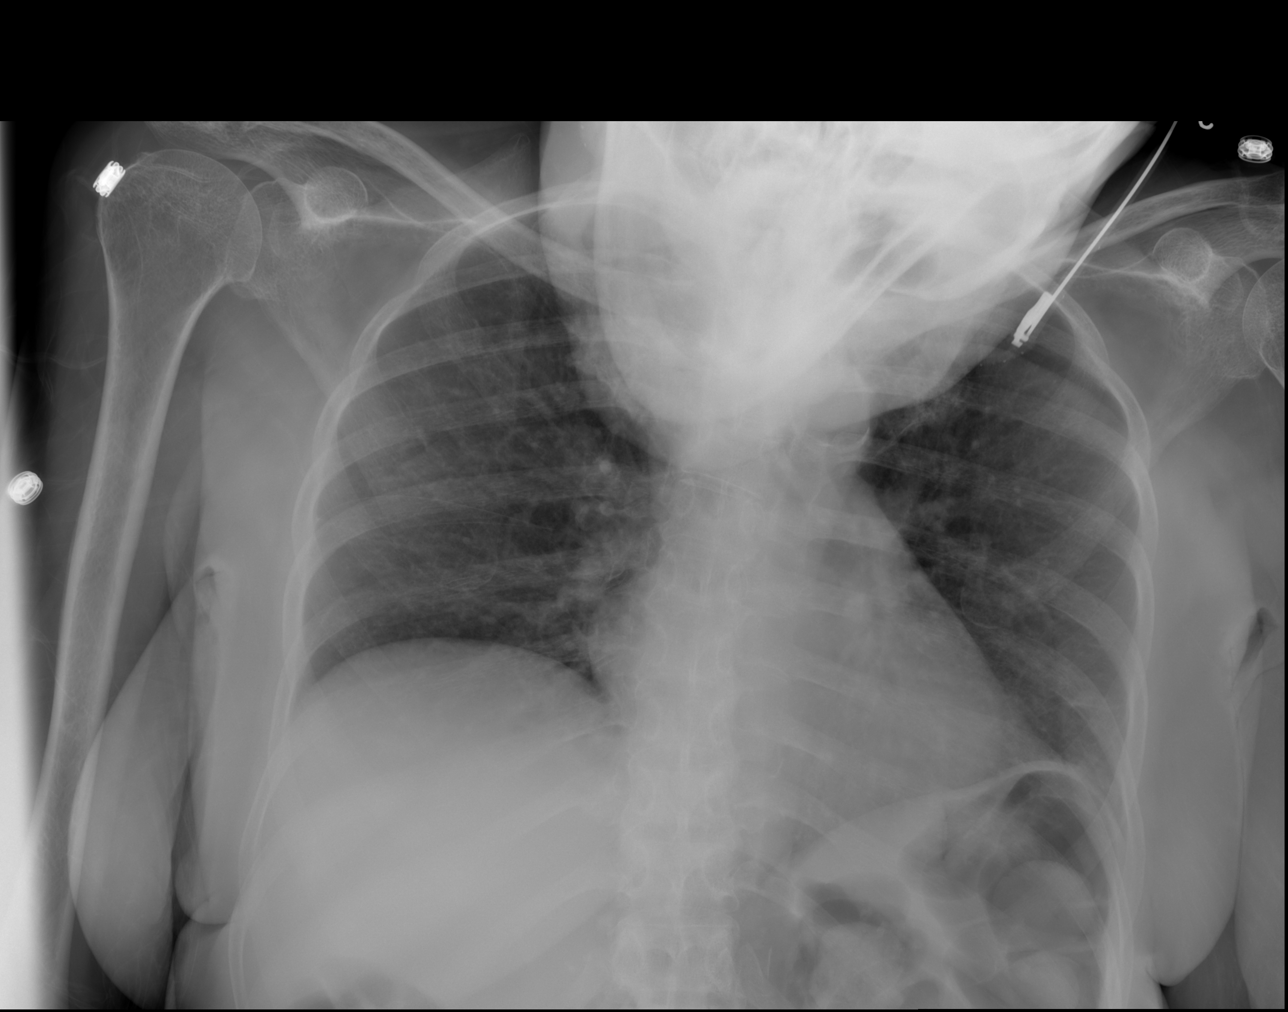

[x chest ap (2 of 2)]
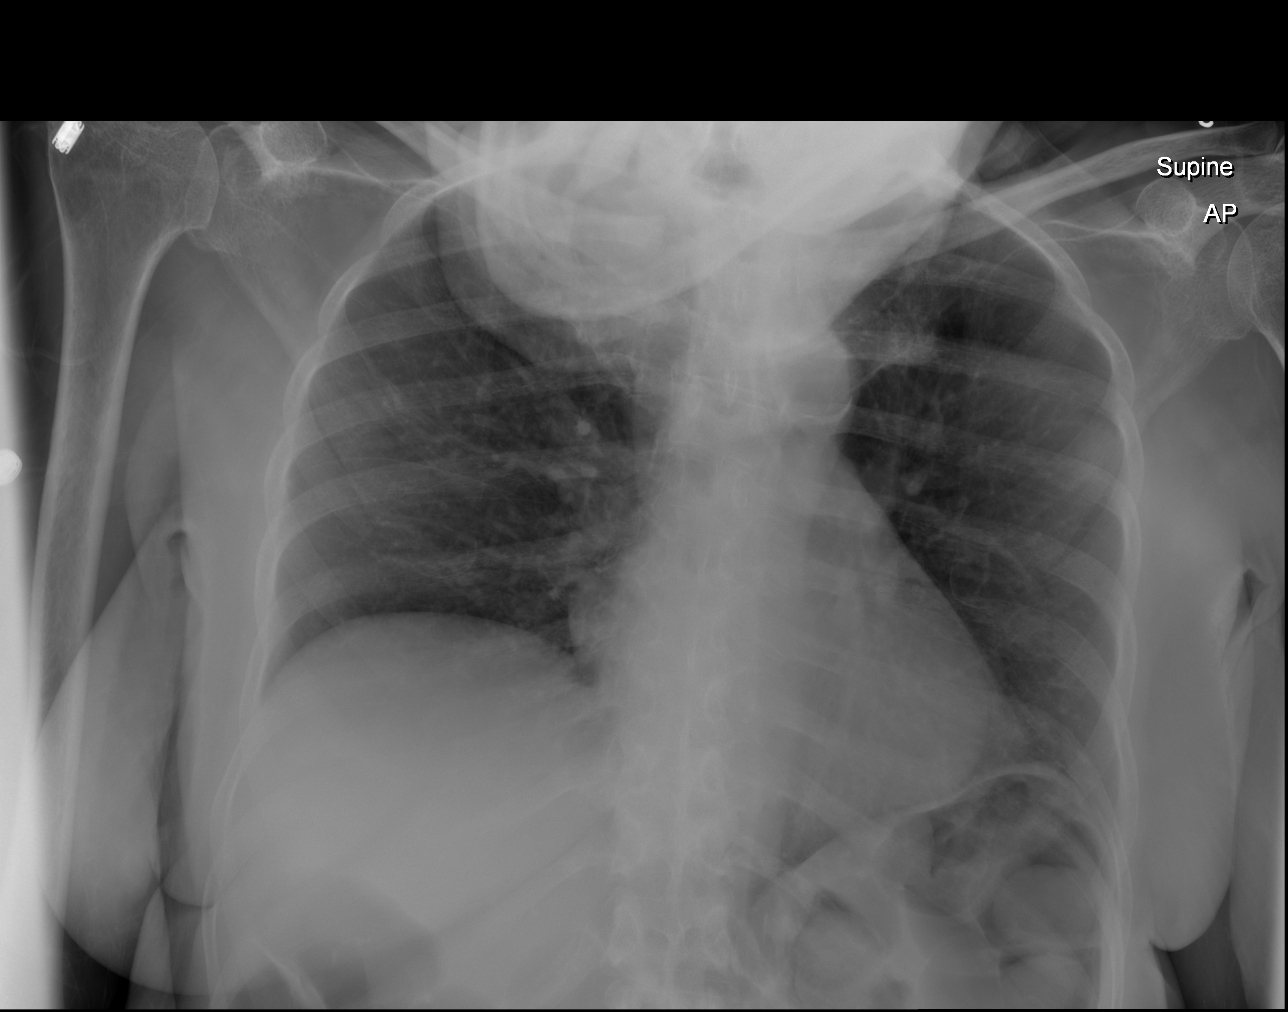

[3 of 3 positions shown; findings below may reference images not displayed]

FINDINGS: Shallow inspiration. Cervical thoracic kyphosis and degenerative
change. Heart size and pulmonary vascularity appear normal.
Calcified and tortuous aorta. No focal airspace disease or
consolidation in the lungs. No blunting of costophrenic angles.
Degenerative changes in the shoulders.
IMPRESSION: No evidence of active pulmonary disease.

## 2016-04-29 DIAGNOSIS — E7800 Pure hypercholesterolemia, unspecified: Secondary | ICD-10-CM | POA: Diagnosis not present

## 2016-04-29 DIAGNOSIS — Z23 Encounter for immunization: Secondary | ICD-10-CM | POA: Diagnosis not present

## 2016-04-29 DIAGNOSIS — I119 Hypertensive heart disease without heart failure: Secondary | ICD-10-CM | POA: Diagnosis not present

## 2016-04-29 DIAGNOSIS — Z6821 Body mass index (BMI) 21.0-21.9, adult: Secondary | ICD-10-CM | POA: Diagnosis not present

## 2016-04-29 DIAGNOSIS — I999 Unspecified disorder of circulatory system: Secondary | ICD-10-CM | POA: Diagnosis not present

## 2016-04-29 DIAGNOSIS — Z79899 Other long term (current) drug therapy: Secondary | ICD-10-CM | POA: Diagnosis not present

## 2016-04-29 DIAGNOSIS — E559 Vitamin D deficiency, unspecified: Secondary | ICD-10-CM | POA: Diagnosis not present

## 2016-04-29 DIAGNOSIS — R5383 Other fatigue: Secondary | ICD-10-CM | POA: Diagnosis not present

## 2016-04-29 DIAGNOSIS — E1151 Type 2 diabetes mellitus with diabetic peripheral angiopathy without gangrene: Secondary | ICD-10-CM | POA: Diagnosis not present

## 2016-04-29 DIAGNOSIS — G35 Multiple sclerosis: Secondary | ICD-10-CM | POA: Diagnosis not present

## 2016-04-29 DIAGNOSIS — E1121 Type 2 diabetes mellitus with diabetic nephropathy: Secondary | ICD-10-CM | POA: Diagnosis not present

## 2016-04-29 DIAGNOSIS — M1990 Unspecified osteoarthritis, unspecified site: Secondary | ICD-10-CM | POA: Diagnosis not present

## 2016-04-29 DIAGNOSIS — M2550 Pain in unspecified joint: Secondary | ICD-10-CM | POA: Diagnosis not present

## 2016-05-28 ENCOUNTER — Ambulatory Visit: Payer: Medicare Other

## 2016-09-02 DIAGNOSIS — Z0001 Encounter for general adult medical examination with abnormal findings: Secondary | ICD-10-CM | POA: Diagnosis not present

## 2016-09-02 DIAGNOSIS — M2550 Pain in unspecified joint: Secondary | ICD-10-CM | POA: Diagnosis not present

## 2016-09-02 DIAGNOSIS — M159 Polyosteoarthritis, unspecified: Secondary | ICD-10-CM | POA: Diagnosis not present

## 2016-09-02 DIAGNOSIS — E1151 Type 2 diabetes mellitus with diabetic peripheral angiopathy without gangrene: Secondary | ICD-10-CM | POA: Diagnosis not present

## 2016-09-26 ENCOUNTER — Ambulatory Visit: Payer: Medicare Other

## 2017-02-06 DIAGNOSIS — M2550 Pain in unspecified joint: Secondary | ICD-10-CM | POA: Diagnosis not present

## 2017-02-06 DIAGNOSIS — G35 Multiple sclerosis: Secondary | ICD-10-CM | POA: Diagnosis not present

## 2017-02-06 DIAGNOSIS — M159 Polyosteoarthritis, unspecified: Secondary | ICD-10-CM | POA: Diagnosis not present

## 2017-02-06 DIAGNOSIS — E1151 Type 2 diabetes mellitus with diabetic peripheral angiopathy without gangrene: Secondary | ICD-10-CM | POA: Diagnosis not present

## 2017-02-12 DIAGNOSIS — H40033 Anatomical narrow angle, bilateral: Secondary | ICD-10-CM | POA: Diagnosis not present

## 2017-02-12 DIAGNOSIS — H401133 Primary open-angle glaucoma, bilateral, severe stage: Secondary | ICD-10-CM | POA: Diagnosis not present

## 2017-02-12 DIAGNOSIS — H2513 Age-related nuclear cataract, bilateral: Secondary | ICD-10-CM | POA: Diagnosis not present

## 2017-02-12 DIAGNOSIS — G35 Multiple sclerosis: Secondary | ICD-10-CM | POA: Diagnosis not present

## 2017-05-14 DIAGNOSIS — H2513 Age-related nuclear cataract, bilateral: Secondary | ICD-10-CM | POA: Diagnosis not present

## 2017-05-14 DIAGNOSIS — H43393 Other vitreous opacities, bilateral: Secondary | ICD-10-CM | POA: Diagnosis not present

## 2017-05-14 DIAGNOSIS — H401133 Primary open-angle glaucoma, bilateral, severe stage: Secondary | ICD-10-CM | POA: Diagnosis not present

## 2017-05-14 DIAGNOSIS — H40033 Anatomical narrow angle, bilateral: Secondary | ICD-10-CM | POA: Diagnosis not present

## 2017-07-01 ENCOUNTER — Other Ambulatory Visit (HOSPITAL_COMMUNITY): Payer: Self-pay | Admitting: Pulmonary Disease

## 2017-07-01 ENCOUNTER — Ambulatory Visit (HOSPITAL_COMMUNITY)
Admission: RE | Admit: 2017-07-01 | Discharge: 2017-07-01 | Disposition: A | Discharge status: Home / Self Care | Payer: Medicare Other | Source: Ambulatory Visit | Attending: Pulmonary Disease | Admitting: Pulmonary Disease

## 2017-07-01 DIAGNOSIS — R609 Edema, unspecified: Secondary | ICD-10-CM | POA: Diagnosis not present

## 2017-07-01 DIAGNOSIS — E1151 Type 2 diabetes mellitus with diabetic peripheral angiopathy without gangrene: Secondary | ICD-10-CM | POA: Diagnosis not present

## 2017-07-01 DIAGNOSIS — E559 Vitamin D deficiency, unspecified: Secondary | ICD-10-CM | POA: Diagnosis not present

## 2017-07-01 DIAGNOSIS — M2550 Pain in unspecified joint: Secondary | ICD-10-CM | POA: Diagnosis not present

## 2017-07-01 DIAGNOSIS — M7989 Other specified soft tissue disorders: Secondary | ICD-10-CM | POA: Insufficient documentation

## 2017-07-01 DIAGNOSIS — G35 Multiple sclerosis: Secondary | ICD-10-CM | POA: Diagnosis not present

## 2017-07-01 DIAGNOSIS — I119 Hypertensive heart disease without heart failure: Secondary | ICD-10-CM | POA: Diagnosis not present

## 2017-07-01 DIAGNOSIS — E7800 Pure hypercholesterolemia, unspecified: Secondary | ICD-10-CM | POA: Diagnosis not present

## 2017-07-01 NOTE — Progress Notes (Signed)
Bilateral lower extremity venous duplex has been completed. Negative for obvious evidence of DVT. Results were given to Evie at Dr. Consuello Bossier office.  07/01/17 2:53 PM Olen Cordial RVT

## 2017-08-20 DIAGNOSIS — H2513 Age-related nuclear cataract, bilateral: Secondary | ICD-10-CM | POA: Diagnosis not present

## 2017-08-20 DIAGNOSIS — G35 Multiple sclerosis: Secondary | ICD-10-CM | POA: Diagnosis not present

## 2017-08-20 DIAGNOSIS — H401133 Primary open-angle glaucoma, bilateral, severe stage: Secondary | ICD-10-CM | POA: Diagnosis not present

## 2017-08-20 DIAGNOSIS — H40033 Anatomical narrow angle, bilateral: Secondary | ICD-10-CM | POA: Diagnosis not present

## 2017-09-12 ENCOUNTER — Ambulatory Visit (HOSPITAL_COMMUNITY)
Admission: EM | Admit: 2017-09-12 | Discharge: 2017-09-12 | Disposition: A | Discharge status: Home / Self Care | Payer: Medicare Other | Attending: Emergency Medicine | Admitting: Emergency Medicine

## 2017-09-12 ENCOUNTER — Other Ambulatory Visit: Payer: Self-pay

## 2017-09-12 ENCOUNTER — Encounter (HOSPITAL_COMMUNITY): Payer: Self-pay

## 2017-09-12 DIAGNOSIS — L259 Unspecified contact dermatitis, unspecified cause: Secondary | ICD-10-CM | POA: Diagnosis not present

## 2017-09-12 MED ORDER — CEPHALEXIN 500 MG PO CAPS: 1000.0000 mg | ORAL_CAPSULE | Freq: Two times a day (BID) | ORAL | 0 refills | Status: AC

## 2017-09-12 MED ORDER — TRIAMCINOLONE ACETONIDE 0.025 % EX OINT: 1.0000 "application " | TOPICAL_OINTMENT | Freq: Two times a day (BID) | CUTANEOUS | 0 refills | Status: DC

## 2017-09-12 MED ORDER — MUPIROCIN 2 % EX OINT: 1.0000 "application " | TOPICAL_OINTMENT | Freq: Three times a day (TID) | CUTANEOUS | 0 refills | Status: DC

## 2017-09-12 NOTE — Discharge Instructions (Addendum)
triamcinolone ointment for the itching, start Claritin or Zyrtec, Bactroban  For the infection.  If not getting better in 48 hours start the Keflex thousand milligrams twice daily for 5 days for an infection.

## 2017-09-12 NOTE — ED Provider Notes (Signed)
HPI  SUBJECTIVE:  Morgan [name] is a 77 y.o. female who presents with a painful itchy rash on the dorsum of both of her hands starting 1 week ago after planning for 10 years.  She describes the pain as soreness.  No burning.  States that it is weeping clear fluid.  States that she has been scratching it.  She reports mild swelling along the dorsum of her hands.  No fevers, body aches, rash elsewhere.  She was not wearing gloves.  She denies using fertilizer but was using miracle grow potting soil.  No change in medications, or new medications.  No new lotions, soaps, detergents, hand sanitizer.  She tried cortisone OTC cream with some improvement in her symptoms.  No aggravating factors.  She has a past medical history of diabetes, hypertension, eczema, MS.  No history of psoriasis.  [id number], Greggory Stallion, MD States that she has a follow-up appointment with him on Tuesday.    Past Medical History:  Diagnosis Date  . Diabetes mellitus without complication (HCC)   . Hypertension   . Multiple sclerosis (HCC)     Past Surgical History:  Procedure Laterality Date  . ABDOMINAL HYSTERECTOMY    . BLADDER SURGERY      Family History  Problem Relation Age of Onset  . Stomach cancer Mother   . Pneumonia Father   . Cancer Father        prostate, lung    Social History   Tobacco Use  . Smoking status: Never Smoker  . Smokeless tobacco: Never Used  Substance Use Topics  . Alcohol use: No  . Drug use: No    No current facility-administered medications for this encounter.   Current Outpatient Medications:  .  carvedilol (COREG) 6.25 MG tablet, Take 6.25 mg by mouth 2 (two) times daily., Disp: , Rfl: 3 .  metFORMIN (GLUMETZA) 500 MG (MOD) 24 hr tablet, Take 500 mg by mouth 2 (two) times daily with a meal. Depending on blood sugar readings, Disp: , Rfl:  .  traMADol (ULTRAM) 50 MG tablet, Take 50-100 mg by mouth 2 (two) times daily. , Disp: , Rfl: 4 .  TRAVATAN Z 0.004 % SOLN ophthalmic  solution, Place 1 drop into both eyes at bedtime., Disp: , Rfl:  .  triamterene-hydrochlorothiazide (MAXZIDE-25) 37.5-25 MG per tablet, Take 1 tablet by mouth daily., Disp: , Rfl: 4 .  cephALEXin (KEFLEX) 500 MG capsule, Take 2 capsules (1,000 mg total) by mouth 2 (two) times daily for 5 days., Disp: 20 capsule, Rfl: 0 .  mupirocin ointment (BACTROBAN) 2 %, Apply 1 application topically 3 (three) times daily., Disp: 22 g, Rfl: 0 .  triamcinolone (KENALOG) 0.025 % ointment, Apply 1 application topically 2 (two) times daily., Disp: 30 g, Rfl: 0  Allergies  Allergen Reactions  . Ibuprofen Rash     ROS  As noted in HPI.   Physical Exam  BP (!) 154/76   Pulse 77   Temp 98.6 F (37 C)   Resp 18   Wt 145 lb (65.8 kg)   SpO2 100%   BMI 24.89 kg/m   Constitutional: Well developed, well nourished, no acute distress Eyes:  EOMI, conjunctiva normal bilaterally HENT: Normocephalic, atraumatic,mucus membranes moist Respiratory: Normal inspiratory effort Cardiovascular: Normal rate GI: nondistended skin: Flat nontender dry scaly rash with excoriations and peeling raw skin dorsum of both hands.  Some excoriations at the right forearm.  Positive serosanguineous drainage.  No rash on the palms of her hands.  See pictures.       Musculoskeletal: no deformities Neurologic: Alert & oriented x 3, no focal neuro deficits Psychiatric: Speech and behavior appropriate   ED Course   Medications - No data to display  No orders of the defined types were placed in this encounter.   No results found for this or any previous visit (from the past 24 hour(s)). No results found.  ED Clinical Impression  Contact dermatitis, unspecified contact dermatitis type, unspecified trigger   ED Assessment/Plan  Presentation consistent with a contact dermatitis with perhaps secondary infection.  No oral steroids because she is a diabetic.  Plan to send home with triamcinolone ointment, have her  start Claritin or Zyrtec, will start some Bactroban.  If not getting better in 48 hours she can start Keflex thousand milligrams twice daily for 5 days.  She will follow-up with her primary care physician on Tuesday.  Is also welcome to return here for reevaluation as needed.  Discussed MDM, treatment plan, and plan for follow-up with patient.  Patient agrees with plan.   Meds ordered this encounter  Medications  . mupirocin ointment (BACTROBAN) 2 %    Sig: Apply 1 application topically 3 (three) times daily.    Dispense:  22 g    Refill:  0  . triamcinolone (KENALOG) 0.025 % ointment    Sig: Apply 1 application topically 2 (two) times daily.    Dispense:  30 g    Refill:  0  . cephALEXin (KEFLEX) 500 MG capsule    Sig: Take 2 capsules (1,000 mg total) by mouth 2 (two) times daily for 5 days.    Dispense:  20 capsule    Refill:  0    *This clinic note was created using Scientist, clinical (histocompatibility and immunogenetics). Therefore, there may be occasional mistakes despite careful proofreading.   ?   Domenick Gong, MD 09/13/17 1711

## 2017-09-12 NOTE — ED Triage Notes (Signed)
Pt presents with rash to both hands, reports planting flowers and it started after that

## 2017-09-16 DIAGNOSIS — E1151 Type 2 diabetes mellitus with diabetic peripheral angiopathy without gangrene: Secondary | ICD-10-CM | POA: Diagnosis not present

## 2017-09-16 DIAGNOSIS — E7800 Pure hypercholesterolemia, unspecified: Secondary | ICD-10-CM | POA: Diagnosis not present

## 2017-09-16 DIAGNOSIS — L209 Atopic dermatitis, unspecified: Secondary | ICD-10-CM | POA: Diagnosis not present

## 2017-09-16 DIAGNOSIS — E559 Vitamin D deficiency, unspecified: Secondary | ICD-10-CM | POA: Diagnosis not present

## 2017-09-16 DIAGNOSIS — M159 Polyosteoarthritis, unspecified: Secondary | ICD-10-CM | POA: Diagnosis not present

## 2017-09-16 DIAGNOSIS — I119 Hypertensive heart disease without heart failure: Secondary | ICD-10-CM | POA: Diagnosis not present

## 2017-09-16 DIAGNOSIS — G35 Multiple sclerosis: Secondary | ICD-10-CM | POA: Diagnosis not present

## 2017-09-19 ENCOUNTER — Ambulatory Visit (HOSPITAL_COMMUNITY)
Admission: EM | Admit: 2017-09-19 | Discharge: 2017-09-19 | Disposition: A | Discharge status: Home / Self Care | Payer: Medicare Other | Attending: Family Medicine | Admitting: Family Medicine

## 2017-09-19 ENCOUNTER — Encounter (HOSPITAL_COMMUNITY): Payer: Self-pay | Admitting: Family Medicine

## 2017-09-19 DIAGNOSIS — R220 Localized swelling, mass and lump, head: Secondary | ICD-10-CM

## 2017-09-19 DIAGNOSIS — L209 Atopic dermatitis, unspecified: Secondary | ICD-10-CM | POA: Diagnosis not present

## 2017-09-19 MED ORDER — METHYLPREDNISOLONE ACETATE 80 MG/ML IJ SUSP
80.0000 mg | Freq: Once | INTRAMUSCULAR | Status: AC
  Administered 2017-09-19: 80 mg via INTRAMUSCULAR

## 2017-09-19 MED ORDER — METHYLPREDNISOLONE ACETATE 80 MG/ML IJ SUSP
INTRAMUSCULAR | Status: AC
  Filled 2017-09-19: qty 1

## 2017-09-19 MED ORDER — FLUOCINONIDE 0.05 % EX OINT: 1.0000 "application " | TOPICAL_OINTMENT | Freq: Two times a day (BID) | CUTANEOUS | 0 refills | Status: DC

## 2017-09-19 NOTE — ED Triage Notes (Addendum)
Pt here for severe right eye. redness and facial swelling that came over night. No pain.  No injury. She also has rash, itching and irritation to hands. She was seen here about a week ago and given a cream, for her hands but she is out

## 2017-09-19 NOTE — Discharge Instructions (Addendum)
You need to stop taking the Altace.  Some people develop an allergy to this type of medicine and the result is facial swelling and eczema.

## 2017-09-19 NOTE — ED Provider Notes (Signed)
[facility]   [id number] 09/19/17 Arrival Time: 1234   SUBJECTIVE:  Morgan [name] is a 77 y.o. female who presents to the urgent care with complaint of severe right eye. redness and facial swelling that came over night. No pain.  No injury. She also has rash, itching and irritation to hands. She was seen here about a week ago and given a cream, for her hands but she is out     Past Medical History:  Diagnosis Date  . Diabetes mellitus without complication (HCC)   . Hypertension   . Multiple sclerosis (HCC)    Family History  Problem Relation Age of Onset  . Stomach cancer Mother   . Pneumonia Father   . Cancer Father        prostate, lung   Social History   Socioeconomic History  . Marital status: Widowed    Spouse name: Not on file  . Number of children: 2  . Years of education: BS  . Highest education level: Not on file  Occupational History  . Occupation: Retired    Associate Professor: OTHER  Social Needs  . Financial resource strain: Not on file  . Food insecurity:    Worry: Not on file    Inability: Not on file  . Transportation needs:    Medical: Not on file    Non-medical: Not on file  Tobacco Use  . Smoking status: Never Smoker  . Smokeless tobacco: Never Used  Substance and Sexual Activity  . Alcohol use: No  . Drug use: No  . Sexual activity: Never    Birth control/protection: Abstinence  Lifestyle  . Physical activity:    Days per week: Not on file    Minutes per session: Not on file  . Stress: Not on file  Relationships  . Social connections:    Talks on phone: Not on file    Gets together: Not on file    Attends religious service: Not on file    Active member of club or organization: Not on file    Attends meetings of clubs or organizations: Not on file    Relationship status: Not on file  . Intimate partner violence:    Fear of current or ex partner: Not on file    Emotionally abused: Not on file    Physically abused: Not on file   Forced sexual activity: Not on file  Other Topics Concern  . Not on file  Social History Narrative   Patient lives at home with son   Caffeine Use 1/2 cup of coffee a day and drinks a fair amount of tea    No outpatient medications have been marked as taking for the 09/19/17 encounter [facility] Encounter).   Allergies  Allergen Reactions  . Ibuprofen Rash      ROS: As per HPI, remainder of ROS negative.   OBJECTIVE:   Vitals:   09/19/17 1256  BP: (!) 149/76  Pulse: 78  Resp: 16  Temp: 98.5 F (36.9 C)  SpO2: 100%     General appearance: alert; no distress Eyes: PERRL; EOMI; conjunctiva normal HENT: normocephalic; atraumatic;  oral mucosa normal Neck: supple Lungs: clear to auscultation bilaterally Heart: regular rate and rhythm Back: no CVA tenderness Extremities: no cyanosis or edema; symmetrical with no gross deformities Skin: warm and dry Neurologic: normal gait; grossly normal Psychological: alert and cooperative; normal mood and affect          Labs:  Results for orders placed or  performed during the hospital encounter of 11/12/14  CBC with Differential  Result Value Ref Range   WBC 10.6 (H) 4.0 - 10.5 K/uL   RBC 4.92 3.87 - 5.11 MIL/uL   Hemoglobin 13.3 12.0 - 15.0 g/dL   HCT 96.0 45.4 - 09.8 %   MCV 83.5 78.0 - 100.0 fL   MCH 27.0 26.0 - 34.0 pg   MCHC 32.4 30.0 - 36.0 g/dL   RDW 11.9 14.7 - 82.9 %   Platelets 259 150 - 400 K/uL   Neutrophils Relative % 68 43 - 77 %   Lymphocytes Relative 15 12 - 46 %   Monocytes Relative 16 (H) 3 - 12 %   Eosinophils Relative 1 0 - 5 %   Basophils Relative 0 0 - 1 %   Neutro Abs 7.2 1.7 - 7.7 K/uL   Lymphs Abs 1.6 0.7 - 4.0 K/uL   Monocytes Absolute 1.7 (H) 0.1 - 1.0 K/uL   Eosinophils Absolute 0.1 0.0 - 0.7 K/uL   Basophils Absolute 0.0 0.0 - 0.1 K/uL   WBC Morphology VACUOLATED NEUTROPHILS    Smear Review LARGE PLATELETS PRESENT   Basic metabolic panel  Result Value Ref Range   Sodium 136 135 -  145 mmol/L   Potassium 3.7 3.5 - 5.1 mmol/L   Chloride 96 (L) 101 - 111 mmol/L   CO2 30 22 - 32 mmol/L   Glucose, Bld 132 (H) 65 - 99 mg/dL   BUN 24 (H) 6 - 20 mg/dL   Creatinine, Ser 5.62 0.44 - 1.00 mg/dL   Calcium 9.4 8.9 - 13.0 mg/dL   GFR calc non Af Amer 55 (L) >60 mL/min   GFR calc Af Amer >60 >60 mL/min   Anion gap 10 5 - 15  Urinalysis, Routine w reflex microscopic (not at [facility])  Result Value Ref Range   Color, Urine YELLOW YELLOW   APPearance CLEAR CLEAR   Specific Gravity, Urine 1.020 1.005 - 1.030   pH 6.0 5.0 - 8.0   Glucose, UA NEGATIVE NEGATIVE mg/dL   Hgb urine dipstick NEGATIVE NEGATIVE   Bilirubin Urine NEGATIVE NEGATIVE   Ketones, ur NEGATIVE NEGATIVE mg/dL   Protein, ur NEGATIVE NEGATIVE mg/dL   Urobilinogen, UA 1.0 0.0 - 1.0 mg/dL   Nitrite NEGATIVE NEGATIVE   Leukocytes, UA SMALL (A) NEGATIVE  Urine microscopic-add on  Result Value Ref Range   Squamous Epithelial / LPF FEW (A) RARE   WBC, UA 11-20 <3 WBC/hpf   Bacteria, UA FEW (A) RARE    Labs Reviewed - No data to display  No results found.     ASSESSMENT & PLAN:  1. Facial swelling   2. Atopic dermatitis, unspecified type   You need to stop taking the Altace.  Some people develop an allergy to this type of medicine and the result is facial swelling and eczema.  Meds ordered this encounter  Medications  . methylPREDNISolone acetate (DEPO-MEDROL) injection 80 mg  . fluocinonide ointment (LIDEX) 0.05 %    Sig: Apply 1 application topically 2 (two) times daily.    Dispense:  30 g    Refill:  0    Reviewed expectations re: course of current medical issues. Questions answered. Outlined signs and symptoms indicating need for more acute intervention. Patient verbalized understanding. After Visit Summary given.    Procedures:      Elvina Sidle, MD 09/19/17 1322

## 2017-12-23 DIAGNOSIS — E1151 Type 2 diabetes mellitus with diabetic peripheral angiopathy without gangrene: Secondary | ICD-10-CM | POA: Diagnosis not present

## 2017-12-23 DIAGNOSIS — G35 Multiple sclerosis: Secondary | ICD-10-CM | POA: Diagnosis not present

## 2017-12-23 DIAGNOSIS — M159 Polyosteoarthritis, unspecified: Secondary | ICD-10-CM | POA: Diagnosis not present

## 2017-12-23 DIAGNOSIS — Z79899 Other long term (current) drug therapy: Secondary | ICD-10-CM | POA: Diagnosis not present

## 2018-04-29 DIAGNOSIS — G35 Multiple sclerosis: Secondary | ICD-10-CM | POA: Diagnosis not present

## 2018-04-29 DIAGNOSIS — H2513 Age-related nuclear cataract, bilateral: Secondary | ICD-10-CM | POA: Diagnosis not present

## 2018-04-29 DIAGNOSIS — H40033 Anatomical narrow angle, bilateral: Secondary | ICD-10-CM | POA: Diagnosis not present

## 2018-04-29 DIAGNOSIS — H401133 Primary open-angle glaucoma, bilateral, severe stage: Secondary | ICD-10-CM | POA: Diagnosis not present

## 2018-06-16 DIAGNOSIS — E559 Vitamin D deficiency, unspecified: Secondary | ICD-10-CM | POA: Diagnosis not present

## 2018-06-16 DIAGNOSIS — Z79899 Other long term (current) drug therapy: Secondary | ICD-10-CM | POA: Diagnosis not present

## 2018-06-16 DIAGNOSIS — E1165 Type 2 diabetes mellitus with hyperglycemia: Secondary | ICD-10-CM | POA: Diagnosis not present

## 2019-04-29 DIAGNOSIS — M2550 Pain in unspecified joint: Secondary | ICD-10-CM | POA: Diagnosis not present

## 2019-04-29 DIAGNOSIS — E1140 Type 2 diabetes mellitus with diabetic neuropathy, unspecified: Secondary | ICD-10-CM | POA: Diagnosis not present

## 2019-04-29 DIAGNOSIS — G35 Multiple sclerosis: Secondary | ICD-10-CM | POA: Diagnosis not present

## 2019-04-29 DIAGNOSIS — E1151 Type 2 diabetes mellitus with diabetic peripheral angiopathy without gangrene: Secondary | ICD-10-CM | POA: Diagnosis not present

## 2019-07-06 DIAGNOSIS — E7800 Pure hypercholesterolemia, unspecified: Secondary | ICD-10-CM | POA: Diagnosis not present

## 2019-07-06 DIAGNOSIS — R5382 Chronic fatigue, unspecified: Secondary | ICD-10-CM | POA: Diagnosis not present

## 2019-07-06 DIAGNOSIS — G35 Multiple sclerosis: Secondary | ICD-10-CM | POA: Diagnosis not present

## 2019-07-06 DIAGNOSIS — M7989 Other specified soft tissue disorders: Secondary | ICD-10-CM | POA: Diagnosis not present

## 2019-07-06 DIAGNOSIS — M159 Polyosteoarthritis, unspecified: Secondary | ICD-10-CM | POA: Diagnosis not present

## 2019-07-06 DIAGNOSIS — E559 Vitamin D deficiency, unspecified: Secondary | ICD-10-CM | POA: Diagnosis not present

## 2019-07-06 DIAGNOSIS — E1151 Type 2 diabetes mellitus with diabetic peripheral angiopathy without gangrene: Secondary | ICD-10-CM | POA: Diagnosis not present

## 2019-07-06 DIAGNOSIS — E1165 Type 2 diabetes mellitus with hyperglycemia: Secondary | ICD-10-CM | POA: Diagnosis not present

## 2019-07-14 ENCOUNTER — Other Ambulatory Visit (HOSPITAL_COMMUNITY): Payer: Self-pay | Admitting: Pulmonary Disease

## 2019-07-14 ENCOUNTER — Encounter (HOSPITAL_COMMUNITY): Payer: Medicare Other

## 2019-07-14 DIAGNOSIS — M7989 Other specified soft tissue disorders: Secondary | ICD-10-CM

## 2019-07-21 ENCOUNTER — Telehealth (HOSPITAL_COMMUNITY): Payer: Self-pay

## 2019-07-21 NOTE — Telephone Encounter (Signed)
The above patient or their representative was contacted and gave the following answers to these questions:         Do you have any of the following symptoms?    NO  Fever                    Cough                   Shortness of breath  Do  you have any of the following other symptoms?    muscle pain         vomiting,        diarrhea        rash         weakness        red eye        abdominal pain         bruising          bruising or bleeding              joint pain           severe headache    Have you been in contact with someone who was or has been sick in the past 2 weeks?  NO  Yes                 Unsure                         Unable to assess   Does the person that you were in contact with have any of the following symptoms?   Cough         shortness of breath           muscle pain         vomiting,            diarrhea            rash            weakness           fever            red eye           abdominal pain           bruising  or  bleeding                joint pain                severe headache                 COMMENTS OR ACTION PLAN FOR THIS PATIENT:        ALL QUESTIONS WERE ANSWERED/[facility] PT HAS BEEN GIVEN BOTH COVID 19 SHOTS

## 2019-07-22 ENCOUNTER — Encounter (HOSPITAL_COMMUNITY): Payer: Medicare Other

## 2019-09-27 DIAGNOSIS — H40033 Anatomical narrow angle, bilateral: Secondary | ICD-10-CM | POA: Diagnosis not present

## 2019-09-27 DIAGNOSIS — H4720 Unspecified optic atrophy: Secondary | ICD-10-CM | POA: Diagnosis not present

## 2019-09-27 DIAGNOSIS — H2513 Age-related nuclear cataract, bilateral: Secondary | ICD-10-CM | POA: Diagnosis not present

## 2019-09-27 DIAGNOSIS — H401133 Primary open-angle glaucoma, bilateral, severe stage: Secondary | ICD-10-CM | POA: Diagnosis not present

## 2019-11-23 DIAGNOSIS — G35 Multiple sclerosis: Secondary | ICD-10-CM | POA: Diagnosis not present

## 2019-11-23 DIAGNOSIS — E1140 Type 2 diabetes mellitus with diabetic neuropathy, unspecified: Secondary | ICD-10-CM | POA: Diagnosis not present

## 2019-11-23 DIAGNOSIS — M159 Polyosteoarthritis, unspecified: Secondary | ICD-10-CM | POA: Diagnosis not present

## 2019-11-23 DIAGNOSIS — E1151 Type 2 diabetes mellitus with diabetic peripheral angiopathy without gangrene: Secondary | ICD-10-CM | POA: Diagnosis not present

## 2020-01-28 DIAGNOSIS — G35 Multiple sclerosis: Secondary | ICD-10-CM | POA: Diagnosis not present

## 2020-01-28 DIAGNOSIS — H40033 Anatomical narrow angle, bilateral: Secondary | ICD-10-CM | POA: Diagnosis not present

## 2020-01-28 DIAGNOSIS — H2513 Age-related nuclear cataract, bilateral: Secondary | ICD-10-CM | POA: Diagnosis not present

## 2020-01-28 DIAGNOSIS — H401133 Primary open-angle glaucoma, bilateral, severe stage: Secondary | ICD-10-CM | POA: Diagnosis not present

## 2020-08-10 DIAGNOSIS — E7800 Pure hypercholesterolemia, unspecified: Secondary | ICD-10-CM | POA: Diagnosis not present

## 2020-08-10 DIAGNOSIS — M1990 Unspecified osteoarthritis, unspecified site: Secondary | ICD-10-CM | POA: Diagnosis not present

## 2020-08-10 DIAGNOSIS — Z0001 Encounter for general adult medical examination with abnormal findings: Secondary | ICD-10-CM | POA: Diagnosis not present

## 2020-08-10 DIAGNOSIS — E1140 Type 2 diabetes mellitus with diabetic neuropathy, unspecified: Secondary | ICD-10-CM | POA: Diagnosis not present

## 2020-08-10 DIAGNOSIS — Z79899 Other long term (current) drug therapy: Secondary | ICD-10-CM | POA: Diagnosis not present

## 2020-08-10 DIAGNOSIS — G35 Multiple sclerosis: Secondary | ICD-10-CM | POA: Diagnosis not present

## 2020-08-10 DIAGNOSIS — E1165 Type 2 diabetes mellitus with hyperglycemia: Secondary | ICD-10-CM | POA: Diagnosis not present

## 2020-08-10 DIAGNOSIS — E559 Vitamin D deficiency, unspecified: Secondary | ICD-10-CM | POA: Diagnosis not present

## 2020-12-14 DIAGNOSIS — G35 Multiple sclerosis: Secondary | ICD-10-CM | POA: Diagnosis not present

## 2020-12-14 DIAGNOSIS — E1140 Type 2 diabetes mellitus with diabetic neuropathy, unspecified: Secondary | ICD-10-CM | POA: Diagnosis not present

## 2020-12-14 DIAGNOSIS — Z6827 Body mass index (BMI) 27.0-27.9, adult: Secondary | ICD-10-CM | POA: Diagnosis not present

## 2020-12-14 DIAGNOSIS — M159 Polyosteoarthritis, unspecified: Secondary | ICD-10-CM | POA: Diagnosis not present

## 2021-01-19 DIAGNOSIS — H4720 Unspecified optic atrophy: Secondary | ICD-10-CM | POA: Diagnosis not present

## 2021-01-19 DIAGNOSIS — H2513 Age-related nuclear cataract, bilateral: Secondary | ICD-10-CM | POA: Diagnosis not present

## 2021-01-19 DIAGNOSIS — H401132 Primary open-angle glaucoma, bilateral, moderate stage: Secondary | ICD-10-CM | POA: Diagnosis not present

## 2021-01-19 DIAGNOSIS — H40033 Anatomical narrow angle, bilateral: Secondary | ICD-10-CM | POA: Diagnosis not present

## 2021-09-13 DIAGNOSIS — G35 Multiple sclerosis: Secondary | ICD-10-CM | POA: Diagnosis not present

## 2021-09-13 DIAGNOSIS — Z0000 Encounter for general adult medical examination without abnormal findings: Secondary | ICD-10-CM | POA: Diagnosis not present

## 2021-09-13 DIAGNOSIS — E119 Type 2 diabetes mellitus without complications: Secondary | ICD-10-CM | POA: Diagnosis not present

## 2021-09-13 DIAGNOSIS — I10 Essential (primary) hypertension: Secondary | ICD-10-CM | POA: Diagnosis not present

## 2022-01-25 DIAGNOSIS — H2513 Age-related nuclear cataract, bilateral: Secondary | ICD-10-CM | POA: Diagnosis not present

## 2022-01-25 DIAGNOSIS — H4720 Unspecified optic atrophy: Secondary | ICD-10-CM | POA: Diagnosis not present

## 2022-01-25 DIAGNOSIS — H40033 Anatomical narrow angle, bilateral: Secondary | ICD-10-CM | POA: Diagnosis not present

## 2022-01-25 DIAGNOSIS — H401132 Primary open-angle glaucoma, bilateral, moderate stage: Secondary | ICD-10-CM | POA: Diagnosis not present

## 2022-03-04 ENCOUNTER — Ambulatory Visit (HOSPITAL_COMMUNITY)
Admission: EM | Admit: 2022-03-04 | Discharge: 2022-03-04 | Disposition: A | Discharge status: Home / Self Care | Payer: Medicare Other | Attending: Emergency Medicine | Admitting: Emergency Medicine

## 2022-03-04 ENCOUNTER — Other Ambulatory Visit: Payer: Self-pay

## 2022-03-04 ENCOUNTER — Encounter (HOSPITAL_COMMUNITY): Payer: Self-pay | Admitting: *Deleted

## 2022-03-04 DIAGNOSIS — R1031 Right lower quadrant pain: Secondary | ICD-10-CM | POA: Diagnosis not present

## 2022-03-04 DIAGNOSIS — W1811XA Fall from or off toilet without subsequent striking against object, initial encounter: Secondary | ICD-10-CM

## 2022-03-04 MED ORDER — METHYLPREDNISOLONE SODIUM SUCC 125 MG IJ SOLR
60.0000 mg | Freq: Once | INTRAMUSCULAR | Status: AC
  Administered 2022-03-04: 60 mg via INTRAMUSCULAR

## 2022-03-04 MED ORDER — PREDNISONE 20 MG PO TABS: 40.0000 mg | ORAL_TABLET | Freq: Every day | ORAL | 0 refills | Status: DC

## 2022-03-04 MED ORDER — DICLOFENAC SODIUM 1 % EX GEL: 2.0000 g | Freq: Four times a day (QID) | CUTANEOUS | 0 refills | Status: DC

## 2022-03-04 MED ORDER — METHYLPREDNISOLONE SODIUM SUCC 125 MG IJ SOLR
INTRAMUSCULAR | Status: AC
  Filled 2022-03-04: qty 2

## 2022-03-04 NOTE — ED Triage Notes (Signed)
Pt fell off the toilet 2 weeks ago . Pt now has Rt groin pain . Pt ambulatory to room with cane. Pt reports she does not have pain when sitting but has pain when walking.

## 2022-03-04 NOTE — ED Provider Notes (Signed)
[facility]    CSN: [id number] Arrival date & time: 03/04/22  1223      History   Chief Complaint Chief Complaint  Patient presents with   Fall    HPI Morgan [name] is a 81 y.o. female.   Patient presents with right groin pain beginning 2 weeks ago after a fall.  Endorses that she fell off the side of the toilet while using the restroom.  Endorses pain can be felt only when walking, rating it 8 out of 10 and described as aching.  Symptoms are gradually worsening.  Using cane at baseline.  Has attempted use of Tylenol which has been minimally helpful.  History of diabetes, hypertension and MS.  Past Medical History:  Diagnosis Date   Diabetes mellitus without complication (HCC)    Hypertension    Multiple sclerosis (HCC)     Patient Active Problem List   Diagnosis Date Noted   Multiple sclerosis (HCC) 11/01/2013    Past Surgical History:  Procedure Laterality Date   ABDOMINAL HYSTERECTOMY     BLADDER SURGERY      OB History   No obstetric history on file.      Home Medications    Prior to Admission medications   Medication Sig Start Date End Date Taking? Authorizing Provider  carvedilol (COREG) 6.25 MG tablet Take 6.25 mg by mouth 2 (two) times daily. 08/12/14   [provider]  fluocinonide ointment (LIDEX) 0.05 % Apply 1 application topically 2 (two) times daily. 09/19/17   Elvina Sidle, MD  metFORMIN (GLUMETZA) 500 MG (MOD) 24 hr tablet Take 500 mg by mouth 2 (two) times daily with a meal. Depending on blood sugar readings    [provider]  traMADol (ULTRAM) 50 MG tablet Take 50-100 mg by mouth 2 (two) times daily.  07/27/14   [provider]  TRAVATAN Z 0.004 % SOLN ophthalmic solution Place 1 drop into both eyes at bedtime. 01/27/14   [provider]  triamcinolone (KENALOG) 0.025 % ointment Apply 1 application topically 2 (two) times daily. 09/12/17   Domenick Gong, MD  triamterene-hydrochlorothiazide  (MAXZIDE-25) 37.5-25 MG per tablet Take 1 tablet by mouth daily. 06/23/14   [provider]    Family History Family History  Problem Relation Age of Onset   Stomach cancer Mother    Pneumonia Father    Cancer Father        prostate, lung    Social History Social History   Tobacco Use   Smoking status: Never   Smokeless tobacco: Never  Substance Use Topics   Alcohol use: No   Drug use: No     Allergies   Ibuprofen   Review of Systems Review of Systems  Constitutional: Negative.   Respiratory: Negative.    Cardiovascular: Negative.   Musculoskeletal:  Positive for myalgias. Negative for arthralgias, back pain, gait problem, joint swelling, neck pain and neck stiffness.  Skin: Negative.   Neurological: Negative.      Physical Exam Triage Vital Signs ED Triage Vitals  Enc Vitals Group     BP 03/04/22 1359 (!) 153/81     Pulse Rate 03/04/22 1359 79     Resp 03/04/22 1359 20     Temp 03/04/22 1359 98.6 F (37 C)     Temp src --      SpO2 03/04/22 1359 96 %     Weight --      Height --      Head  Circumference --      Peak Flow --      Pain Score 03/04/22 1357 8     Pain Loc --      Pain Edu? --      Excl. in [place]? --    No data found.  Updated Vital Signs BP (!) 153/81   Pulse 79   Temp 98.6 F (37 C)   Resp 20   SpO2 96%   Visual Acuity Right Eye Distance:   Left Eye Distance:   Bilateral Distance:    Right Eye Near:   Left Eye Near:    Bilateral Near:     Physical Exam Constitutional:      Appearance: Normal appearance.  HENT:     Head: Normocephalic.  Eyes:     Extraocular Movements: Extraocular movements intact.  Pulmonary:     Effort: Pulmonary effort is normal.  Musculoskeletal:     Comments: Unable to reproduce tenderness along the right groin, no ecchymosis, swelling or deformity present, able to bear weight, range of motion of the hip is intact  Neurological:     Mental Status: She is alert and oriented to person,  place, and time. Mental status is at baseline.  Psychiatric:        Mood and Affect: Mood normal.        Behavior: Behavior normal.      UC Treatments / Results  Labs (all labs ordered are listed, but only abnormal results are displayed) Labs Reviewed - No data to display  EKG   Radiology No results found.  Procedures Procedures (including critical care time)  Medications Ordered in UC Medications - No data to display  Initial Impression / Assessment and Plan / UC Course  I have reviewed the triage vital signs and the nursing notes.  Pertinent labs & imaging results that were available during my care of the patient were reviewed by me and considered in my medical decision making (see chart for details).  Right groin pain  Etiology is most likely irritation to the tendon or ligament, low suspicion for bone involvement and therefore will defer imaging, discussed with patient, methylprednisolone injection given in office and prescribed prednisone for outpatient as patient is unable to tolerate NSAIDs, diclofenac topical gel prescribed in addition, recommend ice or heat, massage and activity as tolerated, recommended follow-up with PCP in 1 week if symptoms worsen or persist  Final Clinical Impressions(s) / UC Diagnoses   Final diagnoses:  None   Discharge Instructions   None    ED Prescriptions   None    PDMP not reviewed this encounter.   Hans Eden, NP 03/04/22 1424

## 2022-03-04 NOTE — Discharge Instructions (Signed)
Today you are being treated for your groin pain which is most likely result of irritation to a tendon or ligament due to your recent fall, at this time I have low suspicion for bone involvement and therefore we will defer imaging  You have been given an injection of steroids today in the office to reduce inflammation that naturally occurs with injury, ideally some of your pain will start to lessen about 30 minutes to an hour  Starting tomorrow take prednisone every morning with food for the next 5 days, this medicine will continue the process above, this medicine will temporarily increase your blood sugars however patient return to normal once you stop use, you may continue use of Tylenol while using this  You may use diclofenac gel over the affected area every 6 hours as needed for additional comfort  May place ice or heat over the affected area in 10 to 15-minute intervals,   you may massage the area as tolerated  If your symptoms continue to persist please follow-up with your primary doctor in 1 week for reevaluation

## 2022-04-09 DIAGNOSIS — I10 Essential (primary) hypertension: Secondary | ICD-10-CM | POA: Diagnosis not present

## 2022-04-09 DIAGNOSIS — Z23 Encounter for immunization: Secondary | ICD-10-CM | POA: Diagnosis not present

## 2022-04-09 DIAGNOSIS — G35 Multiple sclerosis: Secondary | ICD-10-CM | POA: Diagnosis not present

## 2022-04-09 DIAGNOSIS — E119 Type 2 diabetes mellitus without complications: Secondary | ICD-10-CM | POA: Diagnosis not present

## 2022-10-15 DIAGNOSIS — I10 Essential (primary) hypertension: Secondary | ICD-10-CM | POA: Diagnosis not present

## 2022-10-15 DIAGNOSIS — G35 Multiple sclerosis: Secondary | ICD-10-CM | POA: Diagnosis not present

## 2022-10-15 DIAGNOSIS — E119 Type 2 diabetes mellitus without complications: Secondary | ICD-10-CM | POA: Diagnosis not present

## 2022-10-15 DIAGNOSIS — Z0000 Encounter for general adult medical examination without abnormal findings: Secondary | ICD-10-CM | POA: Diagnosis not present

## 2022-10-25 DIAGNOSIS — H2513 Age-related nuclear cataract, bilateral: Secondary | ICD-10-CM | POA: Diagnosis not present

## 2022-10-25 DIAGNOSIS — H4720 Unspecified optic atrophy: Secondary | ICD-10-CM | POA: Diagnosis not present

## 2022-10-25 DIAGNOSIS — H40033 Anatomical narrow angle, bilateral: Secondary | ICD-10-CM | POA: Diagnosis not present

## 2022-10-25 DIAGNOSIS — H401132 Primary open-angle glaucoma, bilateral, moderate stage: Secondary | ICD-10-CM | POA: Diagnosis not present

## 2023-03-14 ENCOUNTER — Encounter (HOSPITAL_COMMUNITY): Payer: Self-pay

## 2023-03-14 ENCOUNTER — Other Ambulatory Visit: Payer: Self-pay

## 2023-03-14 ENCOUNTER — Emergency Department (HOSPITAL_COMMUNITY)
Admission: EM | Admit: 2023-03-14 | Discharge: 2023-03-15 | Discharge status: Against Medical Advice | Payer: Medicare Other | Attending: Emergency Medicine | Admitting: Emergency Medicine

## 2023-03-14 ENCOUNTER — Ambulatory Visit
Admission: EM | Admit: 2023-03-14 | Discharge: 2023-03-14 | Disposition: A | Discharge status: Home / Self Care | Payer: Medicare Other

## 2023-03-14 DIAGNOSIS — R2240 Localized swelling, mass and lump, unspecified lower limb: Secondary | ICD-10-CM | POA: Diagnosis not present

## 2023-03-14 DIAGNOSIS — E119 Type 2 diabetes mellitus without complications: Secondary | ICD-10-CM | POA: Insufficient documentation

## 2023-03-14 DIAGNOSIS — Z5321 Procedure and treatment not carried out due to patient leaving prior to being seen by health care provider: Secondary | ICD-10-CM | POA: Insufficient documentation

## 2023-03-14 DIAGNOSIS — I10 Essential (primary) hypertension: Secondary | ICD-10-CM | POA: Insufficient documentation

## 2023-03-14 DIAGNOSIS — M7989 Other specified soft tissue disorders: Secondary | ICD-10-CM | POA: Insufficient documentation

## 2023-03-14 DIAGNOSIS — R2241 Localized swelling, mass and lump, right lower limb: Secondary | ICD-10-CM

## 2023-03-14 DIAGNOSIS — G35 Multiple sclerosis: Secondary | ICD-10-CM | POA: Insufficient documentation

## 2023-03-14 LAB — URINALYSIS, ROUTINE W REFLEX MICROSCOPIC
Bilirubin Urine: NEGATIVE
Glucose, UA: NEGATIVE mg/dL
Hgb urine dipstick: NEGATIVE
Ketones, ur: NEGATIVE mg/dL
Nitrite: NEGATIVE
Protein, ur: NEGATIVE mg/dL
Specific Gravity, Urine: 1.018 (ref 1.005–1.030)
pH: 5 (ref 5.0–8.0)

## 2023-03-14 LAB — COMPREHENSIVE METABOLIC PANEL
ALT: 14 U/L (ref 0–44)
AST: 18 U/L (ref 15–41)
Albumin: 3.8 g/dL (ref 3.5–5.0)
Alkaline Phosphatase: 84 U/L (ref 38–126)
Anion gap: 8 (ref 5–15)
BUN: 23 mg/dL (ref 8–23)
CO2: 20 mmol/L — ABNORMAL LOW (ref 22–32)
Calcium: 9.3 mg/dL (ref 8.9–10.3)
Chloride: 108 mmol/L (ref 98–111)
Creatinine, Ser: 1.07 mg/dL — ABNORMAL HIGH (ref 0.44–1.00)
GFR, Estimated: 51 mL/min — ABNORMAL LOW (ref >60)
Glucose, Bld: 128 mg/dL — ABNORMAL HIGH (ref 70–99)
Potassium: 4.5 mmol/L (ref 3.5–5.1)
Sodium: 136 mmol/L (ref 135–145)
Total Bilirubin: 0.5 mg/dL (ref 0.3–1.2)
Total Protein: 7.8 g/dL (ref 6.5–8.1)

## 2023-03-14 LAB — CBC
HCT: 38.5 % (ref 36.0–46.0)
Hemoglobin: 11.7 g/dL — ABNORMAL LOW (ref 12.0–15.0)
MCH: 29.2 pg (ref 26.0–34.0)
MCHC: 30.4 g/dL (ref 30.0–36.0)
MCV: 96 fL (ref 80.0–100.0)
Platelets: 241 10*3/uL (ref 150–400)
RBC: 4.01 MIL/uL (ref 3.87–5.11)
RDW: 14.4 % (ref 11.5–15.5)
WBC: 8.2 10*3/uL (ref 4.0–10.5)
nRBC: 0 % (ref 0.0–0.2)

## 2023-03-14 NOTE — ED Triage Notes (Signed)
Pt came in via POV d/t Rt leg swelling she noted yesterday between her calf & ankle. A/Ox4, denies any pain other than when she touches it. Denies any SOB or any recent falls/injuries.

## 2023-03-14 NOTE — ED Provider Notes (Signed)
[facility]    CSN: [id number] Arrival date & time: 03/14/23  1737      History   Chief Complaint Chief Complaint  Patient presents with   Leg Swelling    HPI Morgan [name] is a 82 y.o. female.   Patient presents with her son who also helps provide history.  Patient reports that she has had right lower extremity swelling and a tingling sensation for about 2 days.  Denies any pain or injury to the area.  Denies any fever.  Reports that she had this approximately 20 years ago and was told that she had phlebitis.     Past Medical History:  Diagnosis Date   Diabetes mellitus without complication (HCC)    Hypertension    Multiple sclerosis (HCC)     Patient Active Problem List   Diagnosis Date Noted   Multiple sclerosis (HCC) 11/01/2013    Past Surgical History:  Procedure Laterality Date   ABDOMINAL HYSTERECTOMY     BLADDER SURGERY      OB History   No obstetric history on file.      Home Medications    Prior to Admission medications   Medication Sig Start Date End Date Taking? Authorizing Provider  carvedilol (COREG) 6.25 MG tablet Take 6.25 mg by mouth 2 (two) times daily. 08/12/14   [provider]  TRAVATAN Z 0.004 % SOLN ophthalmic solution Place 1 drop into both eyes at bedtime. 01/27/14   [provider]  triamterene-hydrochlorothiazide (MAXZIDE-25) 37.5-25 MG per tablet Take 1 tablet by mouth daily. 06/23/14   [provider]    Family History Family History  Problem Relation Age of Onset   Stomach cancer Mother    Pneumonia Father    Cancer Father        prostate, lung    Social History Social History   Tobacco Use   Smoking status: Never   Smokeless tobacco: Never  Substance Use Topics   Alcohol use: No   Drug use: No     Allergies   Ibuprofen   Review of Systems Review of Systems Per HPI  Physical Exam Triage Vital Signs ED Triage Vitals  Encounter Vitals Group     BP 03/14/23 1750  (!) 176/90     Systolic BP Percentile --      Diastolic BP Percentile --      Pulse Rate 03/14/23 1750 (!) 103     Resp 03/14/23 1750 16     Temp 03/14/23 1750 98.7 F (37.1 C)     Temp Source 03/14/23 1750 Oral     SpO2 03/14/23 1750 95 %     Weight 03/14/23 1749 175 lb (79.4 kg)     Height 03/14/23 1749 5\' 7"  (1.702 m)     Head Circumference --      Peak Flow --      Pain Score 03/14/23 1749 0     Pain Loc --      Pain Education --      Exclude from Growth Chart --    No data found.  Updated Vital Signs BP (!) 176/90 (BP Location: Left Arm)   Pulse (!) 103   Temp 98.7 F (37.1 C) (Oral)   Resp 16   Ht 5\' 7"  (1.702 m)   Wt 175 lb (79.4 kg)   SpO2 95%   BMI 27.41 kg/m   Visual Acuity Right Eye Distance:   Left Eye Distance:   Bilateral Distance:  Right Eye Near:   Left Eye Near:    Bilateral Near:     Physical Exam Constitutional:      General: She is not in acute distress.    Appearance: Normal appearance. She is not toxic-appearing or diaphoretic.  HENT:     Head: Normocephalic and atraumatic.  Eyes:     Extraocular Movements: Extraocular movements intact.     Conjunctiva/sclera: Conjunctivae normal.  Pulmonary:     Effort: Pulmonary effort is normal.  Skin:    Comments: Patient has an erythematous discoloration present to the right lower extremity.  Tenderness to palpation present to right calf as well.  Unable to adequately assess neurovascular status as pedal pulses appear to be not easily palpable.  Neurological:     General: No focal deficit present.     Mental Status: She is alert and oriented to person, place, and time. Mental status is at baseline.  Psychiatric:        Mood and Affect: Mood normal.        Behavior: Behavior normal.        Thought Content: Thought content normal.        Judgment: Judgment normal.      UC Treatments / Results  Labs (all labs ordered are listed, but only abnormal results are displayed) Labs Reviewed - No  data to display  EKG   Radiology No results found.  Procedures Procedures (including critical care time)  Medications Ordered in UC Medications - No data to display  Initial Impression / Assessment and Plan / UC Course  I have reviewed the triage vital signs and the nursing notes.  Pertinent labs & imaging results that were available during my care of the patient were reviewed by me and considered in my medical decision making (see chart for details).     Differential diagnoses include DVT versus phlebitis versus cellulitis.  I do think patient needs ultrasound to rule out DVT which cannot be performed here at urgent care given time of day and weekend day.  Therefore, advised patient that she will need to go to the emergency department to have this more promptly evaluated and she was agreeable with plan.  Vital signs stable at discharge.  Agree with patient's son transporting her to the emergency department. Final Clinical Impressions(s) / UC Diagnoses   Final diagnoses:  Localized swelling of right lower leg   Discharge Instructions   None    ED Prescriptions   None    PDMP not reviewed this encounter.   Gustavus Bryant, [place] 03/14/23 [phone]

## 2023-03-14 NOTE — ED Triage Notes (Signed)
Patient here today with c/o right leg swelling X 2 days. She has felt some tingling in her right leg and it is tender.

## 2023-03-14 NOTE — ED Notes (Signed)
Patient is being discharged from the Urgent Care and sent to the Emergency Department via self . Per provider, patient is in need of higher level of care due to leg swelling (needs further testing). Patient is aware and verbalizes understanding of plan of care.  Vitals:   03/14/23 1750  BP: (!) 176/90  Pulse: (!) 103  Resp: 16  Temp: 98.7 F (37.1 C)  SpO2: 95%

## 2023-03-15 ENCOUNTER — Emergency Department (HOSPITAL_COMMUNITY)
Admission: EM | Admit: 2023-03-15 | Discharge: 2023-03-15 | Disposition: A | Discharge status: Home / Self Care | Payer: Medicare Other | Attending: Emergency Medicine | Admitting: Emergency Medicine

## 2023-03-15 ENCOUNTER — Other Ambulatory Visit: Payer: Self-pay

## 2023-03-15 ENCOUNTER — Encounter (HOSPITAL_COMMUNITY): Payer: Self-pay

## 2023-03-15 DIAGNOSIS — E119 Type 2 diabetes mellitus without complications: Secondary | ICD-10-CM | POA: Insufficient documentation

## 2023-03-15 DIAGNOSIS — M7989 Other specified soft tissue disorders: Secondary | ICD-10-CM | POA: Insufficient documentation

## 2023-03-15 DIAGNOSIS — I10 Essential (primary) hypertension: Secondary | ICD-10-CM | POA: Insufficient documentation

## 2023-03-15 DIAGNOSIS — R2240 Localized swelling, mass and lump, unspecified lower limb: Secondary | ICD-10-CM | POA: Diagnosis not present

## 2023-03-15 NOTE — Discharge Instructions (Addendum)
It was a pleasure caring for you today. Please return to [facility] Monday morning for an ultrasound of your right leg. Seek emergency care if experiencing any new or worsening symptoms such as shortness of breath or severe leg pain.

## 2023-03-15 NOTE — ED Triage Notes (Addendum)
Patient reports right lower leg swelling x 4 days. Denies injury. States it is not painful. Extremity is warm to the touch and pulses are palpable. Patient was seen at [facility] last night and had labs done.

## 2023-03-15 NOTE — ED Notes (Signed)
Pt and family stated that they wanted to go home to bed. Pt and family seen leaving the ED

## 2023-03-15 NOTE — ED Provider Notes (Signed)
[place] EMERGENCY DEPARTMENT AT [facility] Provider Note   CSN: [id number] Arrival date & time: 03/15/23  1538     History  Chief Complaint  Patient presents with   Leg Swelling    ROSELINDA LINGG is a 82 y.o. female with PMHx DM, HTN, multiple sclerosis who presents to ED concerned for right leg swelling x4 days. Patient denies any trauma to this leg. States that she had phlebitis in this leg 20 years ago. Denies pain in this leg and is able to ambulate without difficulty. Has been taking tylenol which has been helping pain.  Denies fever, chest pain, dyspnea, cough, nausea, vomiting, diarrhea.    HPI     Home Medications Prior to Admission medications   Medication Sig Start Date End Date Taking? Authorizing Provider  carvedilol (COREG) 6.25 MG tablet Take 6.25 mg by mouth 2 (two) times daily. 08/12/14   [provider]  TRAVATAN Z 0.004 % SOLN ophthalmic solution Place 1 drop into both eyes at bedtime. 01/27/14   [provider]  triamterene-hydrochlorothiazide (MAXZIDE-25) 37.5-25 MG per tablet Take 1 tablet by mouth daily. 06/23/14   [provider]      Allergies    Ibuprofen    Review of Systems   Review of Systems  Cardiovascular:  Positive for leg swelling.    Physical Exam Updated Vital Signs BP (!) 146/81   Pulse 68   Temp 99 F (37.2 C) (Oral)   Resp 16   Ht 5\' 7"  (1.702 m)   Wt 77.1 kg   SpO2 100%   BMI 26.63 kg/m  Physical Exam Vitals and nursing note reviewed.  Constitutional:      General: She is not in acute distress.    Appearance: She is not ill-appearing or toxic-appearing.  HENT:     Head: Normocephalic and atraumatic.     Mouth/Throat:     Mouth: Mucous membranes are moist.  Eyes:     General: No scleral icterus.       Right eye: No discharge.        Left eye: No discharge.     Conjunctiva/sclera: Conjunctivae normal.  Cardiovascular:     Rate and Rhythm: Normal rate and regular rhythm.      Pulses: Normal pulses.     Heart sounds: Normal heart sounds. No murmur heard. Pulmonary:     Effort: Pulmonary effort is normal. No respiratory distress.     Breath sounds: Normal breath sounds. No wheezing, rhonchi or rales.  Abdominal:     General: Abdomen is flat.  Musculoskeletal:     Right lower leg: No edema.     Left lower leg: No edema.     Comments: No calf tenderness to palpation. +1 pedal pulse. Sensation to light touch intact. No pitting edema. Right leg is mildly swollen when compared to left leg.   Skin:    General: Skin is warm and dry.     Findings: No rash.     Comments: No increased warmth or erythema. No fluctuance or breakdown in skin.   Neurological:     General: No focal deficit present.     Mental Status: She is alert. Mental status is at baseline.  Psychiatric:        Mood and Affect: Mood normal.     ED Results / Procedures / Treatments   Labs (all labs ordered are listed, but only abnormal results are displayed) Labs Reviewed - No data to display  EKG None  Radiology No results found.  Procedures Procedures    Medications Ordered in ED Medications - No data to display  ED Course/ Medical Decision Making/ A&P                                 Medical Decision Making  This patient presents to the ED for concern of right leg swelling, this involves an extensive number of treatment options, and is a complaint that carries with it a high risk of complications and morbidity.  The differential diagnosis includes CHF, DVT, cellulitis, phlebitis, compartment syndrome, vascular insufficiency   Co morbidities that complicate the patient evaluation  DM, HTN, MS   Additional history obtained:  Additional history obtained from labs obtained yesterday: CBC: no leukocytosis; mild anemia CMP: BUN/Cr reassuring. No severe electrolyte abnormalities.   Problem List / ED Course / Critical interventions / Medication management  Patient presents to ED  concerned for right lower leg swelling x4 days. Denies pain, difficulty ambulating, fever, or any respiratory complaint. Patient was referred to ED from urgent care for DVT US. Physical exam with very mild swelling of right leg when compared to left leg. No calf tenderness. No increased warmth or erythema. Rest of physical exam unremarkable. Family member at bedside agreeing that leg does not appear erythematous or overly swollen today.  Labs from yesterday were reviewed and reassuring.  DVT cannot be ruled out at this time. No [place] techs currently in ED. Will send ambulatory referral for outpatient DVT US. Shared with patient and family member that this will happen first thing Monday morning. Patient and family member endorsed understanding of plan. Patient without any concerning complications/symptoms of DVT/PE such as dyspnea/SOB/leg pain/chest pain. Recommended following up with PCP. Patient endorsed understanding of plan.   Staffed patient with Dr. Estell Harpin. I have reviewed the patients home medicines and have made adjustments as needed Patient afebrile with stable vitals. Provided with return precautions. Discharged in good condition.    Social Determinants of Health:  none           Final Clinical Impression(s) / ED Diagnoses Final diagnoses:  Leg swelling    Rx / DC Orders ED Discharge Orders          Ordered    LE VENOUS        03/15/23 2008              Dorthy Cooler, [place] 03/15/23 2338    Bethann Berkshire, MD 03/17/23 1151

## 2023-03-16 ENCOUNTER — Ambulatory Visit (HOSPITAL_COMMUNITY): Admission: RE | Admit: 2023-03-16 | Payer: Medicare Other | Source: Ambulatory Visit

## 2023-03-17 ENCOUNTER — Ambulatory Visit (HOSPITAL_COMMUNITY)
Admission: RE | Admit: 2023-03-17 | Discharge: 2023-03-17 | Disposition: A | Discharge status: Home / Self Care | Payer: Medicare Other | Source: Ambulatory Visit | Attending: Emergency Medicine | Admitting: Emergency Medicine

## 2023-03-17 DIAGNOSIS — M79661 Pain in right lower leg: Secondary | ICD-10-CM | POA: Insufficient documentation

## 2023-03-17 DIAGNOSIS — M7989 Other specified soft tissue disorders: Secondary | ICD-10-CM

## 2023-03-18 DIAGNOSIS — E119 Type 2 diabetes mellitus without complications: Secondary | ICD-10-CM | POA: Diagnosis not present

## 2023-03-18 DIAGNOSIS — I10 Essential (primary) hypertension: Secondary | ICD-10-CM | POA: Diagnosis not present

## 2023-03-18 DIAGNOSIS — Z23 Encounter for immunization: Secondary | ICD-10-CM | POA: Diagnosis not present

## 2023-03-18 DIAGNOSIS — M7989 Other specified soft tissue disorders: Secondary | ICD-10-CM | POA: Diagnosis not present

## 2023-03-18 DIAGNOSIS — L03115 Cellulitis of right lower limb: Secondary | ICD-10-CM | POA: Diagnosis not present

## 2023-04-01 DIAGNOSIS — I10 Essential (primary) hypertension: Secondary | ICD-10-CM | POA: Diagnosis not present

## 2023-04-01 DIAGNOSIS — G35 Multiple sclerosis: Secondary | ICD-10-CM | POA: Diagnosis not present

## 2023-04-01 DIAGNOSIS — E119 Type 2 diabetes mellitus without complications: Secondary | ICD-10-CM | POA: Diagnosis not present

## 2023-04-01 DIAGNOSIS — M51361 Other intervertebral disc degeneration, lumbar region with lower extremity pain only: Secondary | ICD-10-CM | POA: Diagnosis not present

## 2023-05-05 DIAGNOSIS — H401132 Primary open-angle glaucoma, bilateral, moderate stage: Secondary | ICD-10-CM | POA: Diagnosis not present

## 2023-05-05 DIAGNOSIS — H2513 Age-related nuclear cataract, bilateral: Secondary | ICD-10-CM | POA: Diagnosis not present

## 2023-05-05 DIAGNOSIS — H40033 Anatomical narrow angle, bilateral: Secondary | ICD-10-CM | POA: Diagnosis not present

## 2023-05-05 DIAGNOSIS — H4720 Unspecified optic atrophy: Secondary | ICD-10-CM | POA: Diagnosis not present

## 2023-07-03 DIAGNOSIS — Z0000 Encounter for general adult medical examination without abnormal findings: Secondary | ICD-10-CM | POA: Diagnosis not present

## 2023-07-03 DIAGNOSIS — E119 Type 2 diabetes mellitus without complications: Secondary | ICD-10-CM | POA: Diagnosis not present

## 2023-11-03 DIAGNOSIS — H4720 Unspecified optic atrophy: Secondary | ICD-10-CM | POA: Diagnosis not present

## 2023-11-03 DIAGNOSIS — H401132 Primary open-angle glaucoma, bilateral, moderate stage: Secondary | ICD-10-CM | POA: Diagnosis not present

## 2023-11-03 DIAGNOSIS — H2513 Age-related nuclear cataract, bilateral: Secondary | ICD-10-CM | POA: Diagnosis not present

## 2023-11-03 DIAGNOSIS — H40033 Anatomical narrow angle, bilateral: Secondary | ICD-10-CM | POA: Diagnosis not present

## 2023-12-17 DIAGNOSIS — L2089 Other atopic dermatitis: Secondary | ICD-10-CM | POA: Diagnosis not present

## 2024-01-27 DIAGNOSIS — G35 Multiple sclerosis: Secondary | ICD-10-CM | POA: Diagnosis not present

## 2024-01-27 DIAGNOSIS — I10 Essential (primary) hypertension: Secondary | ICD-10-CM | POA: Diagnosis not present

## 2024-01-27 DIAGNOSIS — I872 Venous insufficiency (chronic) (peripheral): Secondary | ICD-10-CM | POA: Diagnosis not present

## 2024-01-27 DIAGNOSIS — E119 Type 2 diabetes mellitus without complications: Secondary | ICD-10-CM | POA: Diagnosis not present

## 2024-02-02 DIAGNOSIS — L2089 Other atopic dermatitis: Secondary | ICD-10-CM | POA: Diagnosis not present
